# Patient Record
Sex: Male | Born: 1975 | Race: Black or African American | Hispanic: No | Marital: Single | Smoking: Current every day smoker
Health system: Southern US, Community
[De-identification: ages and names within clinical notes are randomized; demographics above are authoritative.]

## PROBLEM LIST (undated history)

## (undated) DIAGNOSIS — J40 Bronchitis, not specified as acute or chronic: Secondary | ICD-10-CM

## (undated) HISTORY — PX: THROAT SURGERY: SHX803

---

## 2003-01-18 ENCOUNTER — Emergency Department (HOSPITAL_COMMUNITY): Admission: EM | Admit: 2003-01-18 | Discharge: 2003-01-18 | Payer: Self-pay | Admitting: Emergency Medicine

## 2007-10-27 ENCOUNTER — Emergency Department (HOSPITAL_COMMUNITY): Admission: EM | Admit: 2007-10-27 | Discharge: 2007-10-27 | Payer: Self-pay | Admitting: Emergency Medicine

## 2008-02-16 ENCOUNTER — Emergency Department (HOSPITAL_COMMUNITY): Admission: EM | Admit: 2008-02-16 | Discharge: 2008-02-16 | Payer: Self-pay | Admitting: Family Medicine

## 2008-02-18 ENCOUNTER — Emergency Department (HOSPITAL_COMMUNITY): Admission: EM | Admit: 2008-02-18 | Discharge: 2008-02-19 | Payer: Self-pay | Admitting: Emergency Medicine

## 2008-03-10 ENCOUNTER — Emergency Department (HOSPITAL_COMMUNITY): Admission: EM | Admit: 2008-03-10 | Discharge: 2008-03-10 | Payer: Self-pay | Admitting: Emergency Medicine

## 2011-06-30 ENCOUNTER — Emergency Department (HOSPITAL_COMMUNITY)
Admission: EM | Admit: 2011-06-30 | Discharge: 2011-06-30 | Disposition: A | Discharge status: Home / Self Care | Payer: Self-pay | Attending: Emergency Medicine | Admitting: Emergency Medicine

## 2011-06-30 DIAGNOSIS — F17200 Nicotine dependence, unspecified, uncomplicated: Secondary | ICD-10-CM | POA: Insufficient documentation

## 2011-06-30 DIAGNOSIS — Z202 Contact with and (suspected) exposure to infections with a predominantly sexual mode of transmission: Secondary | ICD-10-CM | POA: Insufficient documentation

## 2011-06-30 DIAGNOSIS — R369 Urethral discharge, unspecified: Secondary | ICD-10-CM | POA: Insufficient documentation

## 2011-06-30 DIAGNOSIS — R300 Dysuria: Secondary | ICD-10-CM | POA: Insufficient documentation

## 2011-06-30 LAB — URINALYSIS, ROUTINE W REFLEX MICROSCOPIC
Hgb urine dipstick: NEGATIVE
Nitrite: NEGATIVE
Specific Gravity, Urine: 1.021 (ref 1.005–1.030)

## 2011-07-15 ENCOUNTER — Emergency Department (HOSPITAL_COMMUNITY)
Admission: EM | Admit: 2011-07-15 | Discharge: 2011-07-15 | Disposition: A | Discharge status: Home / Self Care | Payer: Self-pay | Attending: Emergency Medicine | Admitting: Emergency Medicine

## 2011-07-15 DIAGNOSIS — A5431 Gonococcal conjunctivitis: Secondary | ICD-10-CM | POA: Insufficient documentation

## 2011-07-15 DIAGNOSIS — H5789 Other specified disorders of eye and adnexa: Secondary | ICD-10-CM | POA: Insufficient documentation

## 2011-07-15 DIAGNOSIS — F17200 Nicotine dependence, unspecified, uncomplicated: Secondary | ICD-10-CM | POA: Insufficient documentation

## 2011-07-16 ENCOUNTER — Inpatient Hospital Stay (INDEPENDENT_AMBULATORY_CARE_PROVIDER_SITE_OTHER)
Admission: RE | Admit: 2011-07-16 | Discharge: 2011-07-16 | Disposition: A | Discharge status: Home / Self Care | Payer: Self-pay | Source: Ambulatory Visit | Attending: Emergency Medicine | Admitting: Emergency Medicine

## 2011-07-16 DIAGNOSIS — R6889 Other general symptoms and signs: Secondary | ICD-10-CM

## 2011-08-16 LAB — BASIC METABOLIC PANEL
BUN: 6
Calcium: 8.9
Chloride: 95 — ABNORMAL LOW
Creatinine, Ser: 1.36
GFR calc Af Amer: >60
Glucose, Bld: 113 — ABNORMAL HIGH
Potassium: 3.5
Sodium: 132 — ABNORMAL LOW

## 2011-08-16 LAB — CBC
Hemoglobin: 15
MCHC: 34
MCV: 80.1
Platelets: 114 — ABNORMAL LOW
RBC: 5.49
WBC: 7.4

## 2011-08-16 LAB — DIFFERENTIAL
Basophils Relative: 0
Eosinophils Absolute: 0.1
Neutro Abs: 5.9

## 2011-08-16 LAB — URINALYSIS, ROUTINE W REFLEX MICROSCOPIC
Glucose, UA: NEGATIVE
Nitrite: NEGATIVE
Protein, ur: NEGATIVE
Specific Gravity, Urine: 1.022
pH: 6.5

## 2011-08-16 LAB — RAPID STREP SCREEN (MED CTR MEBANE ONLY): Streptococcus, Group A Screen (Direct): NEGATIVE

## 2012-10-20 ENCOUNTER — Emergency Department (HOSPITAL_COMMUNITY)
Admission: EM | Admit: 2012-10-20 | Discharge: 2012-10-20 | Disposition: A | Discharge status: Home / Self Care | Payer: Self-pay | Attending: Emergency Medicine | Admitting: Emergency Medicine

## 2012-10-20 ENCOUNTER — Encounter (HOSPITAL_COMMUNITY): Payer: Self-pay | Admitting: Physical Medicine and Rehabilitation

## 2012-10-20 DIAGNOSIS — F17200 Nicotine dependence, unspecified, uncomplicated: Secondary | ICD-10-CM | POA: Insufficient documentation

## 2012-10-20 DIAGNOSIS — R369 Urethral discharge, unspecified: Secondary | ICD-10-CM | POA: Insufficient documentation

## 2012-10-20 LAB — URINE MICROSCOPIC-ADD ON

## 2012-10-20 LAB — URINALYSIS, ROUTINE W REFLEX MICROSCOPIC
Bilirubin Urine: NEGATIVE
Glucose, UA: NEGATIVE mg/dL
Hgb urine dipstick: NEGATIVE
Specific Gravity, Urine: 1.027 (ref 1.005–1.030)

## 2012-10-20 MED ORDER — LIDOCAINE HCL (PF) 1 % IJ SOLN
1.0000 mL | Freq: Once | INTRAMUSCULAR | Status: AC
  Administered 2012-10-20: 1 mL

## 2012-10-20 MED ORDER — LIDOCAINE HCL (PF) 1 % IJ SOLN
INTRAMUSCULAR | Status: AC
  Filled 2012-10-20: qty 5

## 2012-10-20 MED ORDER — AZITHROMYCIN 250 MG PO TABS
1000.0000 mg | ORAL_TABLET | Freq: Once | ORAL | Status: AC
  Administered 2012-10-20: 1000 mg via ORAL
  Filled 2012-10-20: qty 4

## 2012-10-20 MED ORDER — CEFTRIAXONE SODIUM 250 MG IJ SOLR
250.0000 mg | Freq: Once | INTRAMUSCULAR | Status: AC
  Administered 2012-10-20: 250 mg via INTRAMUSCULAR
  Filled 2012-10-20: qty 250

## 2012-10-20 NOTE — ED Notes (Signed)
Pt left without signing discharge paperwork.

## 2012-10-20 NOTE — ED Notes (Signed)
Pt presents to department for evaluation of white colored penile discharge and dysuria. Onset Thursday afternoon. Denies abdominal pain. Denies hematuria. Pt states recent unprotected sex with several partners. He is alert and oriented x4. No signs of distress noted.

## 2012-10-20 NOTE — ED Provider Notes (Signed)
History     CSN: [id number]  Arrival date & time 10/20/12  4098   First MD Initiated Contact with Patient 10/20/12 [phone]      Chief Complaint  Patient presents with  . Penile Discharge    (Consider location/radiation/quality/duration/timing/severity/associated sxs/prior treatment) HPI Comments: Patient with a two-day history of weight creamy penile discharge. He denies any burning on urination. He denies any penile pain. Denies any sores or lesions. He denies any abdominal pain. Denies any nausea vomiting or diarrhea. He denies any pain with bowel movements. He does state that he has unprotected sex with multiple partners. Denies history of sexual transmitted disease.  Patient is a 36 y.o. male presenting with penile discharge.  Penile Discharge Pertinent negatives include no chest pain, no abdominal pain, no headaches and no shortness of breath.    No past medical history on file.  No past surgical history on file.  History reviewed. No pertinent family history.  History  Substance Use Topics  . Smoking status: Current Every Day Smoker    Types: Cigarettes  . Smokeless tobacco: Not on file  . Alcohol Use: Yes      Review of Systems  Constitutional: Negative for fever, chills, diaphoresis and fatigue.  HENT: Negative for congestion, rhinorrhea and sneezing.   Eyes: Negative.   Respiratory: Negative for cough, chest tightness and shortness of breath.   Cardiovascular: Negative for chest pain and leg swelling.  Gastrointestinal: Negative for nausea, vomiting, abdominal pain, diarrhea and blood in stool.  Genitourinary: Positive for discharge. Negative for frequency, hematuria, flank pain and difficulty urinating.  Musculoskeletal: Negative for back pain and arthralgias.  Skin: Negative for rash.  Neurological: Negative for dizziness, speech difficulty, weakness, numbness and headaches.    Allergies  Review of patient's allergies indicates no known allergies.  Home  Medications  No current outpatient prescriptions on file.  BP 122/68  Pulse 84  Temp 98.6 F (37 C) (Oral)  Resp 18  SpO2 100%  Physical Exam  Constitutional: He is oriented to person, place, and time. He appears well-developed and well-nourished.  HENT:  Head: Normocephalic and atraumatic.  Eyes: Pupils are equal, round, and reactive to light.  Neck: Normal range of motion. Neck supple.  Cardiovascular: Normal rate, regular rhythm and normal heart sounds.   Pulmonary/Chest: Effort normal and breath sounds normal. No respiratory distress. He has no wheezes. He has no rales. He exhibits no tenderness.  Abdominal: Soft. Bowel sounds are normal. There is no tenderness. There is no rebound and no guarding.  Genitourinary: Testes normal and penis normal. Circumcised. No penile erythema or penile tenderness. No discharge found.  Musculoskeletal: Normal range of motion. He exhibits no edema.  Lymphadenopathy:    He has no cervical adenopathy.  Neurological: He is alert and oriented to person, place, and time.  Skin: Skin is warm and dry. No rash noted.  Psychiatric: He has a normal mood and affect.    ED Course  Procedures (including critical care time)  Results for orders placed during the hospital encounter of 10/20/12  URINALYSIS, ROUTINE W REFLEX MICROSCOPIC      Component Value Range   Color, Urine YELLOW  YELLOW   APPearance CLOUDY (*) CLEAR   Specific Gravity, Urine 1.027  1.005 - 1.030   pH 6.0  5.0 - 8.0   Glucose, UA NEGATIVE  NEGATIVE mg/dL   Hgb urine dipstick NEGATIVE  NEGATIVE   Bilirubin Urine NEGATIVE  NEGATIVE   Ketones, ur NEGATIVE  NEGATIVE mg/dL  Protein, ur NEGATIVE  NEGATIVE mg/dL   Urobilinogen, UA 1.0  0.0 - 1.0 mg/dL   Nitrite NEGATIVE  NEGATIVE   Leukocytes, UA MODERATE (*) NEGATIVE  URINE MICROSCOPIC-ADD ON      Component Value Range   Squamous Epithelial / LPF RARE  RARE   WBC, UA 21-50  <3 WBC/hpf   RBC / HPF 0-2  <3 RBC/hpf   Bacteria, UA  RARE  RARE   Urine-Other MUCOUS PRESENT     No results found.   1. Penile discharge       MDM  Patient with history of unprotected sex and penile discharge. He was treated here with Rocephin and Zithromax. He has nothing to suggest prostatitis. Advised him he needs to return here or follow up with the health department if his symptoms are not improving. I advised him in sex he transmitted disease avoidance. I also counseled him that he should followup with the health department for further testing such as HIV and syphilis.        Rolan Bucco, MD 10/20/12 [phone]

## 2012-10-23 LAB — GC/CHLAMYDIA PROBE AMP: CT Probe RNA: NEGATIVE

## 2012-10-28 ENCOUNTER — Telehealth (HOSPITAL_COMMUNITY): Payer: Self-pay | Admitting: Emergency Medicine

## 2012-10-28 NOTE — ED Notes (Signed)
+  Gonorrhea. Patient treated with Rocephin and Zithromax. Per protocol MD. [facility] faxed. °

## 2012-10-29 NOTE — ED Notes (Signed)
Unable to contact patient via phone. Sent letter. °

## 2013-08-22 ENCOUNTER — Emergency Department (HOSPITAL_COMMUNITY)
Admission: EM | Admit: 2013-08-22 | Discharge: 2013-08-22 | Disposition: A | Discharge status: Home / Self Care | Payer: Self-pay | Attending: Emergency Medicine | Admitting: Emergency Medicine

## 2013-08-22 ENCOUNTER — Encounter (HOSPITAL_COMMUNITY): Payer: Self-pay | Admitting: *Deleted

## 2013-08-22 DIAGNOSIS — R300 Dysuria: Secondary | ICD-10-CM | POA: Insufficient documentation

## 2013-08-22 DIAGNOSIS — R369 Urethral discharge, unspecified: Secondary | ICD-10-CM | POA: Insufficient documentation

## 2013-08-22 DIAGNOSIS — N481 Balanitis: Secondary | ICD-10-CM

## 2013-08-22 DIAGNOSIS — N476 Balanoposthitis: Secondary | ICD-10-CM | POA: Insufficient documentation

## 2013-08-22 DIAGNOSIS — F17200 Nicotine dependence, unspecified, uncomplicated: Secondary | ICD-10-CM | POA: Insufficient documentation

## 2013-08-22 MED ORDER — STERILE WATER FOR INJECTION IJ SOLN
INTRAMUSCULAR | Status: AC
  Administered 2013-08-22: 1 mL
  Filled 2013-08-22: qty 10

## 2013-08-22 MED ORDER — HYDROCORTISONE 1 % EX CREA: TOPICAL_CREAM | Freq: Two times a day (BID) | CUTANEOUS | Status: DC

## 2013-08-22 MED ORDER — CEFTRIAXONE SODIUM 250 MG IJ SOLR
250.0000 mg | Freq: Once | INTRAMUSCULAR | Status: AC
  Administered 2013-08-22: 250 mg via INTRAMUSCULAR
  Filled 2013-08-22: qty 250

## 2013-08-22 MED ORDER — AZITHROMYCIN 1 G PO PACK
1.0000 g | PACK | Freq: Once | ORAL | Status: AC
  Administered 2013-08-22: 1 g via ORAL
  Filled 2013-08-22: qty 1

## 2013-08-22 NOTE — ED Provider Notes (Signed)
CSN: [id number]     Arrival date & time 08/22/13  1723 History  This chart was scribed for [facility] Farrior, PA, working with Glynn Octave, MD, by Allene Dillon, ED Scribe. This patient was seen in room TR07C/TR07C and the patient's care was started at 7:58 PM.    Chief Complaint  Patient presents with  . Penile Discharge  . Dysuria   (Consider location/radiation/quality/duration/timing/severity/associated sxs/prior Treatment) The history is provided by the patient. No language interpreter was used.    HPI Comments: John Trevino is a 37 y.o. male who presents to the Emergency Department complaining of penile clear discharge x1 day after having unprotected sex recently. Patient also endorses moderate dysuria since the penile discharge occurred earlier today. Patient is also complaining about dry irritated skin on the penis that has been present for about a week. He denies any alleviating factors or aggravating factors for either the discharge or skin irritation. Patient denies any fevers, penile or testicular swelling.   History reviewed. No pertinent past medical history. History reviewed. No pertinent past surgical history. History reviewed. No pertinent family history. History  Substance Use Topics  . Smoking status: Current Every Day Smoker    Types: Cigarettes  . Smokeless tobacco: Not on file  . Alcohol Use: Yes    Review of Systems  Constitutional: Negative for fever.  Genitourinary: Positive for dysuria and discharge. Negative for frequency, hematuria, flank pain, penile swelling, scrotal swelling and testicular pain.  Skin: Negative for rash and wound.    Allergies  Review of patient's allergies indicates no known allergies.  Home Medications   Current Outpatient Rx  Name  Route  Sig  Dispense  Refill  . Acetaminophen (TYLENOL PO)   Oral   Take 4 tablets by mouth once.         . hydrocortisone cream 1 %   Topical   Apply topically 2 (two) times  daily.   30 g   2    Triage Vitals: BP 140/62  Pulse 61  Temp(Src) 98.2 F (36.8 C) (Oral)  Ht 5\' 10"  (1.778 m)  Wt 190 lb 4.1 oz (86.3 kg)  BMI 27.3 kg/m2  SpO2 98% Physical Exam  Constitutional: He is oriented to person, place, and time. He appears well-developed and well-nourished. No distress.  HENT:  Head: Normocephalic and atraumatic.  Right Ear: External ear normal.  Left Ear: External ear normal.  Nose: Nose normal.  Eyes: Conjunctivae and EOM are normal. Pupils are equal, round, and reactive to light.  Neck: Neck supple.  Cardiovascular: Normal rate, regular rhythm, normal heart sounds and intact distal pulses.   Pulmonary/Chest: Effort normal and breath sounds normal.  Abdominal: Soft. There is no tenderness.  Genitourinary: Testes normal.    Right testis shows no mass, no swelling and no tenderness. Right testis is descended. Left testis shows no mass, no swelling and no tenderness. Left testis is descended. Circumcised. No phimosis, paraphimosis, hypospadias or penile tenderness. Discharge found.  Mildly erythematous region with scaling marked on graphical documentation. No warmth, nontender, no drainage.   Lymphadenopathy:       Right: No inguinal adenopathy present.       Left: No inguinal adenopathy present.  Neurological: He is alert and oriented to person, place, and time. He has normal strength. No cranial nerve deficit or sensory deficit. Gait normal. GCS eye subscore is 4. GCS verbal subscore is 5. GCS motor subscore is 6.  No pronator drift. Bilateral heel-knee-shin intact.  Skin:  Skin is warm and dry. No petechiae and no rash noted. He is not diaphoretic.  Psychiatric: He has a normal mood and affect.    ED Course  Procedures (including critical care time) DIAGNOSTIC STUDIES: Oxygen Saturation is 98% on RA, normal by my interpretation.    COORDINATION OF CARE: 7:58 PM- Pt advised of plan for treatment and pt agrees.    Labs Review Labs Reviewed   GC/CHLAMYDIA PROBE AMP   Imaging Review No results found.  MDM   1. Penile discharge   2. Balanitis     Afebrile, NAD, non-toxic appearing, AAOx4.  Patient to be discharged with instructions to follow up with OBGYN. Pt understands GC/Chlamydia cultures pending and that they will need to inform all sexual partners within the last 6 months if results return positive. Pt has been treated prophylacticly with azithromycin and rocephin due to pts history and PE findings. Also signs of mild balanitis provided hydrocortisone cream for itching, discussed hygiene, but is being covered for GC/Chlamydia at today's visit. Pt advised that she will receive a call in 48 hours if the test is positive and to refrain from sexual activity for 48 hours. If the test is positive, pt is advised to refrain from sexual activity for 10 days for the medicine to take effect.  Discussed that because pt has had recent unprotected sex, might want to consider getting tested for HIV as well. Counseled pt that latex condoms are the only way to prevent against STDs or HIV. Return precautions discussed. Patient is agreeable to plan.          I personally performed the services described in this documentation, which was scribed in my presence. The recorded information has been reviewed and is accurate.     Lise Auer Cheney Ewart, PA-C 08/23/13 0005

## 2013-08-22 NOTE — ED Notes (Signed)
Pt reports unprotected sex. Pt reports dicharge started today.

## 2013-08-22 NOTE — ED Notes (Signed)
Pt states that he has been having burning with urination this afternoon and having a white, milky discharge from his penis

## 2013-08-23 NOTE — ED Provider Notes (Signed)
Medical screening examination/treatment/procedure(s) were performed by non-physician practitioner and as supervising physician I was immediately available for consultation/collaboration.   Corinn Stoltzfus, MD 08/23/13 0027 

## 2013-08-25 ENCOUNTER — Telehealth (HOSPITAL_COMMUNITY): Payer: Self-pay | Admitting: Emergency Medicine

## 2013-08-25 NOTE — ED Notes (Signed)
+  gonorrhea. Patient treated with Rocephin and Zithromax. [facility] faxed.

## 2013-08-25 NOTE — ED Notes (Signed)
Patient has +Gonorrhea. °

## 2013-08-27 NOTE — ED Notes (Signed)
Unable to contact via phone letter sent to [facility] address. 

## 2013-10-05 ENCOUNTER — Telehealth (HOSPITAL_COMMUNITY): Payer: Self-pay | Admitting: Emergency Medicine

## 2013-10-05 NOTE — ED Notes (Signed)
No response to letter sent after 30 days. Chart sent to Medical Records. °

## 2013-12-01 ENCOUNTER — Encounter (HOSPITAL_COMMUNITY): Payer: Self-pay | Admitting: Emergency Medicine

## 2013-12-01 ENCOUNTER — Emergency Department (HOSPITAL_COMMUNITY)
Admission: EM | Admit: 2013-12-01 | Discharge: 2013-12-01 | Disposition: A | Discharge status: Home / Self Care | Payer: Self-pay | Attending: Emergency Medicine | Admitting: Emergency Medicine

## 2013-12-01 DIAGNOSIS — M62830 Muscle spasm of back: Secondary | ICD-10-CM

## 2013-12-01 DIAGNOSIS — M5380 Other specified dorsopathies, site unspecified: Secondary | ICD-10-CM | POA: Insufficient documentation

## 2013-12-01 DIAGNOSIS — F17200 Nicotine dependence, unspecified, uncomplicated: Secondary | ICD-10-CM | POA: Insufficient documentation

## 2013-12-01 MED ORDER — CYCLOBENZAPRINE HCL 10 MG PO TABS: 10.0000 mg | ORAL_TABLET | Freq: Three times a day (TID) | ORAL | Status: DC | PRN

## 2013-12-01 MED ORDER — IBUPROFEN 800 MG PO TABS: 800.0000 mg | ORAL_TABLET | Freq: Three times a day (TID) | ORAL | Status: DC

## 2013-12-01 MED ORDER — CYCLOBENZAPRINE HCL 10 MG PO TABS
10.0000 mg | ORAL_TABLET | Freq: Once | ORAL | Status: AC
  Administered 2013-12-01: 10 mg via ORAL
  Filled 2013-12-01: qty 1

## 2013-12-01 NOTE — Discharge Instructions (Signed)
Please follow up with your primary care physician in 1-2 days. If you do not have one please call one from list below. Please take Flexeril as prescribed, please do not drive on this medication as it may make you drowsy. Please take Motrin as prescribed. Please read all discharge instructions and return precautions.   Back Pain, Adult Low back pain is very common. About 1 in 5 people have back pain.The cause of low back pain is rarely dangerous. The pain often gets better over time.About half of people with a sudden onset of back pain feel better in just 2 weeks. About 8 in 10 people feel better by 6 weeks.  CAUSES Some common causes of back pain include:  Strain of the muscles or ligaments supporting the spine.  Wear and tear (degeneration) of the spinal discs.  Arthritis.  Direct injury to the back. DIAGNOSIS Most of the time, the direct cause of low back pain is not known.However, back pain can be treated effectively even when the exact cause of the pain is unknown.Answering your caregiver's questions about your overall health and symptoms is one of the most accurate ways to make sure the cause of your pain is not dangerous. If your caregiver needs more information, he or she may order lab work or imaging tests (X-rays or MRIs).However, even if imaging tests show changes in your back, this usually does not require surgery. HOME CARE INSTRUCTIONS For many people, back pain returns.Since low back pain is rarely dangerous, it is often a condition that people can learn to [facility] their own.   Remain active. It is stressful on the back to sit or stand in one place. Do not sit, drive, or stand in one place for more than 30 minutes at a time. Take short walks on level surfaces as soon as pain allows.Try to increase the length of time you walk each day.  Do not stay in bed.Resting more than 1 or 2 days can delay your recovery.  Do not avoid exercise or work.Your body is made to move.It  is not dangerous to be active, even though your back may hurt.Your back will likely heal faster if you return to being active before your pain is gone.  Pay attention to your body when you bend and lift. Many people have less discomfortwhen lifting if they bend their knees, keep the load close to their bodies,and avoid twisting. Often, the most comfortable positions are those that put less stress on your recovering back.  Find a comfortable position to sleep. Use a firm mattress and lie on your side with your knees slightly bent. If you lie on your back, put a pillow under your knees.  Only take over-the-counter or prescription medicines as directed by your caregiver. Over-the-counter medicines to reduce pain and inflammation are often the most helpful.Your caregiver may prescribe muscle relaxant drugs.These medicines help dull your pain so you can more quickly return to your normal activities and healthy exercise.  Put ice on the injured area.  Put ice in a plastic bag.  Place a towel between your skin and the bag.  Leave the ice on for 15-20 minutes, 03-04 times a day for the first 2 to 3 days. After that, ice and heat may be alternated to reduce pain and spasms.  Ask your caregiver about trying back exercises and gentle massage. This may be of some benefit.  Avoid feeling anxious or stressed.Stress increases muscle tension and can worsen back pain.It is important to recognize  when you are anxious or stressed and learn ways to manage it.Exercise is a great option. SEEK MEDICAL CARE IF:  You have pain that is not relieved with rest or medicine.  You have pain that does not improve in 1 week.  You have new symptoms.  You are generally not feeling well. SEEK IMMEDIATE MEDICAL CARE IF:   You have pain that radiates from your back into your legs.  You develop new bowel or bladder control problems.  You have unusual weakness or numbness in your arms or legs.  You develop  nausea or vomiting.  You develop abdominal pain.  You feel faint. Document Released: 11/08/2005 Document Revised: 05/09/2012 Document Reviewed: 03/29/2011 [facility] Patient Information 2014 [place], [place].  Muscle Cramps and Spasms Muscle cramps and spasms occur when a muscle or muscles tighten and you have no control over this tightening (involuntary muscle contraction). They are a common problem and can develop in any muscle. The most common place is in the calf muscles of the leg. Both muscle cramps and muscle spasms are involuntary muscle contractions, but they also have differences:   Muscle cramps are sporadic and painful. They may last a few seconds to a quarter of an hour. Muscle cramps are often more forceful and last longer than muscle spasms.  Muscle spasms may or may not be painful. They may also last just a few seconds or much longer. CAUSES  It is uncommon for cramps or spasms to be due to a serious underlying problem. In many cases, the cause of cramps or spasms is unknown. Some common causes are:   Overexertion.   Overuse from repetitive motions (doing the same thing over and over).   Remaining in a certain position for a long period of time.   Improper preparation, form, or technique while performing a sport or activity.   Dehydration.   Injury.   Side effects of some medicines.   Abnormally low levels of the salts and ions in your blood (electrolytes), especially potassium and calcium. This could happen if you are taking water pills (diuretics) or you are pregnant.  Some underlying medical problems can make it more likely to develop cramps or spasms. These include, but are not limited to:   Diabetes.   Parkinson disease.   Hormone disorders, such as thyroid problems.   Alcohol abuse.   Diseases specific to muscles, joints, and bones.   Blood vessel disease where not enough blood is getting to the muscles.  HOME CARE INSTRUCTIONS   Stay  well hydrated. Drink enough water and fluids to keep your urine clear or pale yellow.  It may be helpful to massage, stretch, and relax the affected muscle.  For tight or tense muscles, use a warm towel, heating pad, or hot shower water directed to the affected area.  If you are sore or have pain after a cramp or spasm, applying ice to the affected area may relieve discomfort.  Put ice in a plastic bag.  Place a towel between your skin and the bag.  Leave the ice on for 15-20 minutes, 03-04 times a day.  Medicines used to treat a known cause of cramps or spasms may help reduce their frequency or severity. Only take over-the-counter or prescription medicines as directed by your caregiver. SEEK MEDICAL CARE IF:  Your cramps or spasms get more severe, more frequent, or do not improve over time.  MAKE SURE YOU:   Understand these instructions.  Will watch your condition.  Will get  help right away if you are not doing well or get worse. Document Released: 04/30/2002 Document Revised: 03/05/2013 Document Reviewed: 10/25/2012 [facility] Patient Information 2014 [place], [place].

## 2013-12-01 NOTE — ED Notes (Signed)
Pt reports lower back pain x3 days after sneezing. Reports previous injury to back when he was younger. Denies urinary sx or fever.

## 2013-12-01 NOTE — ED Provider Notes (Signed)
CSN: [id number]     Arrival date & time 12/01/13  4098 History   First MD Initiated Contact with Patient 12/01/13 [phone]     Chief Complaint  Patient presents with  . Back Pain   (Consider location/radiation/quality/duration/timing/severity/associated sxs/prior Treatment) HPI Comments: Patient is a 38 year old male past medical history significant for tobacco use presenting to the emergency department for right lower back pain that began 3 days ago after sneezing. Patient describes his pain as moderate to severe tightness without radiation. He has tried over-the-counter pain medications and heating pads with minimal relief. He states his pain is aggravated by walking. He denies any fevers, bladder or bowel incontinence, history of back surgeries, history of cancer, history of IV drug use.  Patient is a 38 y.o. male presenting with back pain.  Back Pain Associated symptoms: no fever     History reviewed. No pertinent past medical history. History reviewed. No pertinent past surgical history. No family history on file. History  Substance Use Topics  . Smoking status: Current Every Day Smoker    Types: Cigarettes  . Smokeless tobacco: Not on file  . Alcohol Use: Yes    Review of Systems  Constitutional: Negative for fever.  Musculoskeletal: Positive for back pain. Negative for neck pain.  All other systems reviewed and are negative.    Allergies  Review of patient's allergies indicates no known allergies.  Home Medications   Current Outpatient Rx  Name  Route  Sig  Dispense  Refill  . aspirin-acetaminophen-caffeine (EXCEDRIN MIGRAINE) 250-250-65 MG per tablet   Oral   Take 1-2 tablets by mouth every 6 (six) hours as needed for headache.         . ibuprofen (ADVIL,MOTRIN) 200 MG tablet   Oral   Take 200-800 mg by mouth every 6 (six) hours as needed for mild pain or moderate pain.         . cyclobenzaprine (FLEXERIL) 10 MG tablet   Oral   Take 1 tablet (10 mg total) by  mouth 3 (three) times daily as needed for muscle spasms.   10 tablet   0   . ibuprofen (ADVIL,MOTRIN) 800 MG tablet   Oral   Take 1 tablet (800 mg total) by mouth 3 (three) times daily.   21 tablet   0    There were no vitals taken for this visit. Physical Exam  Constitutional: He is oriented to person, place, and time. He appears well-developed and well-nourished. No distress.  HENT:  Head: Normocephalic and atraumatic.  Right Ear: External ear normal.  Left Ear: External ear normal.  Nose: Nose normal.  Mouth/Throat: Oropharynx is clear and moist. No oropharyngeal exudate.  Eyes: Conjunctivae and EOM are normal. Pupils are equal, round, and reactive to light.  Neck: Normal range of motion. Neck supple.  Cardiovascular: Normal rate, regular rhythm, normal heart sounds and intact distal pulses.   Pulmonary/Chest: Effort normal and breath sounds normal. No respiratory distress.  Abdominal: Soft. There is no tenderness.  Musculoskeletal:       Cervical back: Normal.       Thoracic back: Normal.       Lumbar back: He exhibits tenderness and spasm. He exhibits normal range of motion and no bony tenderness.       Back:  Neurological: He is alert and oriented to person, place, and time. He has normal strength. No cranial nerve deficit or sensory deficit. Gait normal. GCS eye subscore is 4. GCS verbal subscore is 5. GCS  motor subscore is 6.  No pronator drift. Bilateral heel-knee-shin intact.  Skin: Skin is warm and dry. He is not diaphoretic.    ED Course  Procedures (including critical care time) Medications  cyclobenzaprine (FLEXERIL) tablet 10 mg (not administered)    Labs Review Labs Reviewed - No data to display Imaging Review No results found.  EKG Interpretation   None       MDM   1. Back spasm     Afebrile, NAD, non-toxic appearing, AAOx4. Patient with back pain.  No neurological deficits and normal neuro exam.  Patient can walk but states is painful.  No  loss of bowel or bladder control.  No concern for cauda equina.  No fever, night sweats, weight loss, h/o cancer, IVDU.  RICE protocol and pain medicine indicated and discussed with patient.       Jeannetta EllisJennifer L Farheen Pfahler, PA-C 12/01/13 [phone]

## 2013-12-01 NOTE — ED Provider Notes (Signed)
Medical screening examination/treatment/procedure(s) were performed by non-physician practitioner and as supervising physician I was immediately available for consultation/collaboration.   Mykel Sponaugle, MD 12/01/13 0800 

## 2013-12-01 NOTE — ED Notes (Signed)
C/o R lower back pain, (denies: nvd, fever, loss of control of bowel or bladder, weakness, tripping, radiation, numbness/tinglling/ burning or other sx), took off brand of excedrin (not sure/given to him), no meds in last 8 hrs.

## 2014-01-26 DIAGNOSIS — R369 Urethral discharge, unspecified: Secondary | ICD-10-CM | POA: Insufficient documentation

## 2014-01-26 DIAGNOSIS — Z8619 Personal history of other infectious and parasitic diseases: Secondary | ICD-10-CM | POA: Insufficient documentation

## 2014-01-26 DIAGNOSIS — R300 Dysuria: Secondary | ICD-10-CM | POA: Insufficient documentation

## 2014-01-26 DIAGNOSIS — Z113 Encounter for screening for infections with a predominantly sexual mode of transmission: Secondary | ICD-10-CM | POA: Insufficient documentation

## 2014-01-26 DIAGNOSIS — Z791 Long term (current) use of non-steroidal anti-inflammatories (NSAID): Secondary | ICD-10-CM | POA: Insufficient documentation

## 2014-01-26 DIAGNOSIS — F17200 Nicotine dependence, unspecified, uncomplicated: Secondary | ICD-10-CM | POA: Insufficient documentation

## 2014-01-26 DIAGNOSIS — N489 Disorder of penis, unspecified: Secondary | ICD-10-CM | POA: Insufficient documentation

## 2014-01-27 ENCOUNTER — Encounter (HOSPITAL_COMMUNITY): Payer: Self-pay | Admitting: Emergency Medicine

## 2014-01-27 ENCOUNTER — Emergency Department (HOSPITAL_COMMUNITY)
Admission: EM | Admit: 2014-01-27 | Discharge: 2014-01-27 | Disposition: A | Discharge status: Home / Self Care | Payer: Self-pay | Attending: Emergency Medicine | Admitting: Emergency Medicine

## 2014-01-27 DIAGNOSIS — R369 Urethral discharge, unspecified: Secondary | ICD-10-CM

## 2014-01-27 DIAGNOSIS — Z711 Person with feared health complaint in whom no diagnosis is made: Secondary | ICD-10-CM

## 2014-01-27 LAB — HIV ANTIBODY (ROUTINE TESTING W REFLEX): HIV: NONREACTIVE

## 2014-01-27 MED ORDER — AZITHROMYCIN 250 MG PO TABS
1000.0000 mg | ORAL_TABLET | Freq: Once | ORAL | Status: AC
  Administered 2014-01-27: 1000 mg via ORAL
  Filled 2014-01-27: qty 4

## 2014-01-27 MED ORDER — LIDOCAINE HCL (PF) 1 % IJ SOLN
INTRAMUSCULAR | Status: AC
  Filled 2014-01-27: qty 5

## 2014-01-27 MED ORDER — CEFTRIAXONE SODIUM 250 MG IJ SOLR
250.0000 mg | Freq: Once | INTRAMUSCULAR | Status: AC
  Administered 2014-01-27: 250 mg via INTRAMUSCULAR
  Filled 2014-01-27: qty 250

## 2014-01-27 MED ORDER — LIDOCAINE HCL (CARDIAC) 20 MG/ML IV SOLN
INTRAVENOUS | Status: DC
Start: 2014-01-27 — End: 2014-01-27
  Filled 2014-01-27: qty 5

## 2014-01-27 NOTE — ED Provider Notes (Signed)
Medical screening examination/treatment/procedure(s) were performed by non-physician practitioner and as supervising physician I was immediately available for consultation/collaboration.   EKG Interpretation None       Malissa Slay M Jermya Dowding, MD 01/27/14 0738 

## 2014-01-27 NOTE — ED Notes (Signed)
Pt states that he has discharge and painful urination for 2 days. Pt has history of gonhhera.

## 2014-01-27 NOTE — ED Provider Notes (Signed)
CSN: [id number]     Arrival date & time 01/26/14  2359 History   First MD Initiated Contact with Patient 01/27/14 0017     Chief Complaint  Patient presents with  . SEXUALLY TRANSMITTED DISEASE     (Consider location/radiation/quality/duration/timing/severity/associated sxs/prior Treatment) HPI Pt is a 38yo male with hx of gonorrhea c/o penile discharge and painful urination x2 days.  Pt states he feels like he has gonorrhea again. Does report 1 episode of unprotected sex but states the male was tested and was "clean." denies fever, n/v/d. Has not taken medication PTA. No allergies to medications.  History reviewed. No pertinent past medical history. History reviewed. No pertinent past surgical history. History reviewed. No pertinent family history. History  Substance Use Topics  . Smoking status: Current Every Day Smoker    Types: Cigarettes  . Smokeless tobacco: Not on file  . Alcohol Use: Yes    Review of Systems  Constitutional: Negative for fever and chills.  Gastrointestinal: Negative for nausea and vomiting.  Genitourinary: Positive for dysuria, discharge and penile pain. Negative for hematuria.  Musculoskeletal: Negative for back pain.  All other systems reviewed and are negative.       Allergies  Review of patient's allergies indicates no known allergies.  Home Medications   Current Outpatient Rx  Name  Route  Sig  Dispense  Refill  . aspirin-acetaminophen-caffeine (EXCEDRIN MIGRAINE) 250-250-65 MG per tablet   Oral   Take 1-2 tablets by mouth every 6 (six) hours as needed for headache.         . cyclobenzaprine (FLEXERIL) 10 MG tablet   Oral   Take 1 tablet (10 mg total) by mouth 3 (three) times daily as needed for muscle spasms.   10 tablet   0   . ibuprofen (ADVIL,MOTRIN) 200 MG tablet   Oral   Take 200-800 mg by mouth every 6 (six) hours as needed for mild pain or moderate pain.         Marland Kitchen ibuprofen (ADVIL,MOTRIN) 800 MG tablet   Oral   Take  1 tablet (800 mg total) by mouth 3 (three) times daily.   21 tablet   0    BP 115/74  Pulse 100  Temp(Src) 99 F (37.2 C) (Oral)  Resp 16  Ht 5\' 10"  (1.778 m)  Wt 192 lb 7 oz (87.289 kg)  BMI 27.61 kg/m2  SpO2 98% Physical Exam  Nursing note and vitals reviewed. Constitutional: He is oriented to person, place, and time. He appears well-developed and well-nourished.  HENT:  Head: Normocephalic and atraumatic.  Eyes: EOM are normal.  Neck: Normal range of motion.  Cardiovascular: Normal rate.   Pulmonary/Chest: Effort normal. No respiratory distress.  Abdominal: Soft. There is no tenderness.  Genitourinary: Testes normal. Right testis shows no mass, no swelling and no tenderness. Left testis shows no mass, no swelling and no tenderness. Circumcised. No phimosis, paraphimosis, hypospadias, penile erythema or penile tenderness. Discharge ( scant, yellow-white) found.  Chaperoned exam.  Musculoskeletal: Normal range of motion.  Neurological: He is alert and oriented to person, place, and time.  Skin: Skin is warm and dry.  Psychiatric: He has a normal mood and affect. His behavior is normal.     ED Course  Procedures (including critical care time) Labs Review Labs Reviewed  GC/CHLAMYDIA PROBE AMP  HIV ANTIBODY (ROUTINE TESTING)   Imaging Review No results found.   EKG Interpretation None      MDM   Final diagnoses:  Concern  about STD in male without diagnosis  Penile discharge    Pt presenting with hx of gonorrhea c/o penile discharge and dysuria x2 days. Will tx empirically with azithromycin and rocephin. Advised to f/u with PCP and have all sexual partners tested and tx. Advised pt he can f/u with health department for further STD testing as needed. Pt verbalized understanding and agreement of tx plan.     Junius FinnerErin O'Malley, PA-C 01/27/14 [id number]

## 2014-01-27 NOTE — Discharge Instructions (Signed)
°  Refrain from sexual intercourse for 7 days. Be sure to have all partners tested and treated for STDs.  Practice safe sex by always wearing condoms.  ° °

## 2014-01-28 LAB — GC/CHLAMYDIA PROBE AMP
CT Probe RNA: NEGATIVE
GC Probe RNA: NEGATIVE

## 2014-02-20 ENCOUNTER — Emergency Department (HOSPITAL_COMMUNITY): Payer: Self-pay

## 2014-02-20 ENCOUNTER — Emergency Department (HOSPITAL_COMMUNITY)
Admission: EM | Admit: 2014-02-20 | Discharge: 2014-02-21 | Disposition: A | Discharge status: Home / Self Care | Payer: Self-pay | Attending: Emergency Medicine | Admitting: Emergency Medicine

## 2014-02-20 ENCOUNTER — Encounter (HOSPITAL_COMMUNITY): Payer: Self-pay | Admitting: Emergency Medicine

## 2014-02-20 DIAGNOSIS — J40 Bronchitis, not specified as acute or chronic: Secondary | ICD-10-CM

## 2014-02-20 DIAGNOSIS — F17200 Nicotine dependence, unspecified, uncomplicated: Secondary | ICD-10-CM | POA: Insufficient documentation

## 2014-02-20 DIAGNOSIS — IMO0001 Reserved for inherently not codable concepts without codable children: Secondary | ICD-10-CM | POA: Insufficient documentation

## 2014-02-20 DIAGNOSIS — J209 Acute bronchitis, unspecified: Secondary | ICD-10-CM | POA: Insufficient documentation

## 2014-02-20 MED ORDER — AZITHROMYCIN 250 MG PO TABS: 250.0000 mg | ORAL_TABLET | Freq: Every day | ORAL | Status: DC

## 2014-02-20 MED ORDER — HYDROCODONE-ACETAMINOPHEN 7.5-325 MG/15ML PO SOLN: 10.0000 mL | Freq: Four times a day (QID) | ORAL | Status: DC | PRN

## 2014-02-20 NOTE — ED Notes (Signed)
Pt states cough and body aches for 10 days.  Pt staes "Want to make sure i dont have peumonia"

## 2014-02-20 NOTE — ED Notes (Signed)
Patient transported to X-ray 

## 2014-02-20 NOTE — ED Provider Notes (Signed)
CSN: [id number]     Arrival date & time 02/20/14  2146 History  This chart was scribed for non-physician practitioner, Fayrene Helper, PA-C working with Geoffery Lyons, MD by Luisa Dago, ED scribe. This patient was seen in room TR06C/TR06C and the patient's care was started at 11:19 PM.    Chief Complaint  Patient presents with  . Cough   The history is provided by the patient. No language interpreter was used.   HPI Comments: John Trevino is a 38 y.o. male who presents to the Emergency Department complaining of gradual onset constant cough that started 10 days ago. Pt is also complaining of associated generalized myalgias. Mr. Heap is concerned that she may have "pneumonia". He states that if feels like he has "a lot of fluid" in his chest. He states that he has associated green/yellow colored mucus. He reports taking Mucinex DM with no relief. Pt does not recall being around any sick contacts. Pt does not have a history of asthma. Denies any fever, chills, diaphoresis, nausea, rash, coughing up blood, or emesis. Pt is a habitual smoker.   History reviewed. No pertinent past medical history. History reviewed. No pertinent past surgical history. No family history on file. History  Substance Use Topics  . Smoking status: Current Every Day Smoker -- 0.50 packs/day    Types: Cigarettes  . Smokeless tobacco: Not on file  . Alcohol Use: Yes    Review of Systems  Constitutional: Negative for fever, chills and diaphoresis.  Eyes: Negative for visual disturbance.  Respiratory: Positive for cough. Negative for shortness of breath.   Cardiovascular: Negative for chest pain.  Gastrointestinal: Negative for nausea and abdominal pain.  Musculoskeletal: Positive for myalgias.      Allergies  Review of patient's allergies indicates no known allergies.  Home Medications   Current Outpatient Rx  Name  Route  Sig  Dispense  Refill  . dextromethorphan-guaiFENesin (MUCINEX DM) 30-600 MG per 12 hr  tablet   Oral   Take 1 tablet by mouth 2 (two) times daily as needed for cough.         . Pseudoephedrine-Ibuprofen (ADVIL COLD/SINUS) 30-200 MG TABS   Oral   Take 2 capsules by mouth daily as needed (for cold).          Triage Vitals: BP 161/90  Pulse 91  Temp(Src) 99.1 F (37.3 C) (Oral)  Resp 20  Ht 5\' 10"  (1.778 m)  Wt 190 lb 9 oz (86.439 kg)  BMI 27.34 kg/m2  SpO2 99%  Physical Exam  Nursing note and vitals reviewed. Constitutional: He appears well-developed and well-nourished. No distress.  HENT:  Head: Normocephalic and atraumatic.  Right Ear: Tympanic membrane normal.  Left Ear: Tympanic membrane normal.  Mouth/Throat: Uvula is midline. No oropharyngeal exudate, posterior oropharyngeal edema or posterior oropharyngeal erythema.  Right ear with cerumen impaction. Uvula midlined.  Eyes: Conjunctivae are normal. Right eye exhibits no discharge. Left eye exhibits no discharge.  Neck: Neck supple.  Cardiovascular: Normal rate, regular rhythm and normal heart sounds.  Exam reveals no gallop and no friction rub.   No murmur heard. Pulmonary/Chest: Effort normal. No respiratory distress. He has no wheezes. He has rhonchi (mild). He has no rales.  Abdominal: Soft. He exhibits no distension. There is no tenderness.  Musculoskeletal: He exhibits no edema and no tenderness.  Lymphadenopathy:       Head (right side): No tonsillar adenopathy present.       Head (left side): No tonsillar adenopathy  present.    He has cervical adenopathy.  Neurological: He is alert.  Skin: Skin is warm and dry.  Psychiatric: He has a normal mood and affect. His behavior is normal. Thought content normal.    ED Course  Procedures (including critical care time)  DIAGNOSTIC STUDIES: Oxygen Saturation is 99% on RA, normal by my interpretation.    COORDINATION OF CARE: 11:21 PM-Sxs suggestive of bronchitis or viral infection.  CXR without acute infiltrates.  Given the duration of his sxs  more than 10 days, will prescribe z-pack and cough medication. Reassured pt that his symptoms were not due to pneumonia. Advised him to continue taking the Mucinex and to eat. Pt advised of plan for treatment and pt agrees.  Smoking cessation discussed.    Labs Review Labs Reviewed - No data to display Imaging Review Dg Chest 2 View  02/20/2014   CLINICAL DATA:  COUGH  EXAM: CHEST  2 VIEW  COMPARISON:  CT CHEST W/O CM dated 10/27/2007; DG CHEST 2 VIEW dated 10/27/2007  FINDINGS: The heart size and mediastinal contours are within normal limits. Both lungs are clear. The visualized skeletal structures are unremarkable.  IMPRESSION: No active cardiopulmonary disease.   Electronically Signed   By: Salome HolmesHector  Cooper M.D.   On: 02/20/2014 23:11     EKG Interpretation None      MDM   Final diagnoses:  Bronchitis    BP 161/90  Pulse 91  Temp(Src) 99.1 F (37.3 C) (Oral)  Resp 20  Ht 5\' 10"  (1.778 m)  Wt 190 lb 9 oz (86.439 kg)  BMI 27.34 kg/m2  SpO2 99%   I personally performed the services described in this documentation, which was scribed in my presence. The recorded information has been reviewed and is accurate.     Fayrene HelperBowie Malcomb Gangemi, PA-C 02/20/14 2355

## 2014-02-20 NOTE — Discharge Instructions (Signed)

## 2014-02-21 NOTE — ED Provider Notes (Signed)
Medical screening examination/treatment/procedure(s) were performed by non-physician practitioner and as supervising physician I was immediately available for consultation/collaboration.     Geoffery Lyonsouglas Laquon Emel, MD 02/21/14 0000

## 2014-09-05 ENCOUNTER — Emergency Department (HOSPITAL_COMMUNITY)
Admission: EM | Admit: 2014-09-05 | Discharge: 2014-09-05 | Disposition: A | Discharge status: Home / Self Care | Payer: Self-pay | Attending: Emergency Medicine | Admitting: Emergency Medicine

## 2014-09-05 ENCOUNTER — Encounter (HOSPITAL_COMMUNITY): Payer: Self-pay | Admitting: Emergency Medicine

## 2014-09-05 DIAGNOSIS — R369 Urethral discharge, unspecified: Secondary | ICD-10-CM | POA: Insufficient documentation

## 2014-09-05 DIAGNOSIS — Z720 Tobacco use: Secondary | ICD-10-CM | POA: Insufficient documentation

## 2014-09-05 DIAGNOSIS — R300 Dysuria: Secondary | ICD-10-CM | POA: Insufficient documentation

## 2014-09-05 DIAGNOSIS — Z792 Long term (current) use of antibiotics: Secondary | ICD-10-CM | POA: Insufficient documentation

## 2014-09-05 LAB — HIV ANTIBODY (ROUTINE TESTING W REFLEX): HIV 1&2 Ab, 4th Generation: NONREACTIVE

## 2014-09-05 LAB — RPR

## 2014-09-05 MED ORDER — AZITHROMYCIN 250 MG PO TABS
1000.0000 mg | ORAL_TABLET | Freq: Once | ORAL | Status: AC
  Administered 2014-09-05: 1000 mg via ORAL
  Filled 2014-09-05: qty 4

## 2014-09-05 MED ORDER — CEFTRIAXONE SODIUM 250 MG IJ SOLR
250.0000 mg | Freq: Once | INTRAMUSCULAR | Status: AC
  Administered 2014-09-05: 250 mg via INTRAMUSCULAR
  Filled 2014-09-05: qty 250

## 2014-09-05 MED ORDER — LIDOCAINE HCL (PF) 1 % IJ SOLN
INTRAMUSCULAR | Status: AC
  Administered 2014-09-05: 5 mL
  Filled 2014-09-05: qty 5

## 2014-09-05 NOTE — ED Provider Notes (Signed)
CSN: [id number]     Arrival date & time 09/05/14  1204 History  This chart was scribed for non-physician practitioner Junious SilkHannah Anoushka Divito, working with Richardean Canalavid H Yao, MD by Carl Bestelina Holson, ED Scribe. This patient was seen in room TR09C/TR09C and the patient's care was started at 1:25 PM.    Chief Complaint  Patient presents with  . SEXUALLY TRANSMITTED DISEASE    The history is provided by the patient. No language interpreter was used.   HPI Comments: John Trevino is a 38 y.o. male with a history of gonorrhea who presents to the Emergency Department complaining of constant penile discharge with mild dysuria that started 3 days ago.  He has only been sexually active with one partner.  He states that his symptoms do not feel as severe as the gonorrhea he has had in the past.  He denies fever, chills, abdominal pain, and pain with ejaculation as associated symptoms.    History reviewed. No pertinent past medical history. History reviewed. No pertinent past surgical history. No family history on file. History  Substance Use Topics  . Smoking status: Current Every Day Smoker -- 1.00 packs/day    Types: Cigarettes  . Smokeless tobacco: Not on file  . Alcohol Use: Yes    Review of Systems  Constitutional: Negative for fever and chills.  Gastrointestinal: Negative for abdominal pain.  Genitourinary: Positive for dysuria and discharge.  All other systems reviewed and are negative.     Allergies  Review of patient's allergies indicates no known allergies.  Home Medications   Prior to Admission medications   Medication Sig Start Date End Date Taking? Authorizing Provider  azithromycin (ZITHROMAX) 250 MG tablet Take 1 tablet (250 mg total) by mouth daily. 02/20/14   Fayrene HelperBowie Tran, PA-C  dextromethorphan-guaiFENesin [facility](MUCINEX DM) 30-600 MG per 12 hr tablet Take 1 tablet by mouth 2 (two) times daily as needed for cough.    Historical Provider, MD  HYDROcodone-acetaminophen (HYCET) 7.5-325 mg/15 ml  solution Take 10 mLs by mouth 4 (four) times daily as needed for moderate pain. 02/20/14   Fayrene HelperBowie Tran, PA-C  Pseudoephedrine-Ibuprofen (ADVIL COLD/SINUS) 30-200 MG TABS Take 2 capsules by mouth daily as needed (for cold).    Historical Provider, MD   BP 121/67  Pulse 63  Temp(Src) 98.8 F (37.1 C) (Oral)  Ht 5\' 10"  (1.778 m)  Wt 190 lb (86.183 kg)  BMI 27.26 kg/m2  SpO2 98% Physical Exam  Nursing note and vitals reviewed. Constitutional: He is oriented to person, place, and time. He appears well-developed and well-nourished. No distress.  HENT:  Head: Normocephalic and atraumatic.  Right Ear: External ear normal.  Left Ear: External ear normal.  Nose: Nose normal.  Eyes: Conjunctivae and EOM are normal.  Neck: Normal range of motion. No tracheal deviation present.  Cardiovascular: Normal rate, regular rhythm and normal heart sounds.   Pulmonary/Chest: Effort normal and breath sounds normal. No stridor.  Abdominal: Soft. He exhibits no distension. There is no tenderness.  Genitourinary: Testes normal and penis normal. Right testis shows no mass, no swelling and no tenderness. Left testis shows no mass, no swelling and no tenderness. Circumcised. No penile erythema or penile tenderness. No discharge found.  Musculoskeletal: Normal range of motion.  Neurological: He is alert and oriented to person, place, and time.  Skin: Skin is warm and dry. He is not diaphoretic.  Psychiatric: He has a normal mood and affect. His behavior is normal.    ED Course  Procedures (including  critical care time)  DIAGNOSTIC STUDIES: Oxygen Saturation is 98% on room air, normal by my interpretation.    COORDINATION OF CARE: 1:27 PM- Discussed treating the patient with Rocephin and Azithromycin.  Will call patient with STD test results.  The patient agreed to the treatment plan.  Labs Review Labs Reviewed  GC/CHLAMYDIA PROBE AMP  HIV ANTIBODY (ROUTINE TESTING)  RPR    Imaging Review No results  found.   EKG Interpretation None      MDM   Final diagnoses:  Penile discharge    Patient presents to ED with penile discharge. Treated for GC/Chlamydia in ED. Discussed that GC/Chlamydia, HIV, and RPR do not come back today and he will receive a phone call with positive results. Encouraged to follow [facility]. Discussed reasons to return to ED immediately. Vital signs stable for discharge. Patient / Family / Caregiver informed of clinical course, understand medical decision-making process, and agree with plan.   I personally performed the services described in this documentation, which was scribed in my presence. The recorded information has been reviewed and is accurate.    Mora BellmanHannah S Yaeko Fazekas, PA-C 09/05/14 [phone]

## 2014-09-05 NOTE — ED Notes (Signed)
Declined W/C at D/C and was escorted to lobby by RN. 

## 2014-09-05 NOTE — ED Notes (Signed)
Pt reports penile discharge x 2 days, concerned for STD. Denies dysuria, denies flank pain. Pt in NAD.

## 2014-09-06 LAB — GC/CHLAMYDIA PROBE AMP
CT Probe RNA: NEGATIVE
GC Probe RNA: NEGATIVE

## 2014-09-06 NOTE — ED Provider Notes (Signed)
Medical screening examination/treatment/procedure(s) were performed by non-physician practitioner and as supervising physician I was immediately available for consultation/collaboration.   EKG Interpretation None        David H Yao, MD 09/06/14 1918 

## 2014-12-18 ENCOUNTER — Encounter (HOSPITAL_COMMUNITY): Payer: Self-pay | Admitting: Emergency Medicine

## 2014-12-18 ENCOUNTER — Emergency Department (HOSPITAL_COMMUNITY)
Admission: EM | Admit: 2014-12-18 | Discharge: 2014-12-18 | Disposition: A | Discharge status: Home / Self Care | Payer: Self-pay | Attending: Emergency Medicine | Admitting: Emergency Medicine

## 2014-12-18 DIAGNOSIS — J069 Acute upper respiratory infection, unspecified: Secondary | ICD-10-CM | POA: Insufficient documentation

## 2014-12-18 DIAGNOSIS — Z792 Long term (current) use of antibiotics: Secondary | ICD-10-CM | POA: Insufficient documentation

## 2014-12-18 DIAGNOSIS — Z720 Tobacco use: Secondary | ICD-10-CM | POA: Insufficient documentation

## 2014-12-18 NOTE — Discharge Instructions (Signed)
Upper Respiratory Infection, Adult An upper respiratory infection (URI) is also sometimes known as the common cold. The upper respiratory tract includes the nose, sinuses, throat, trachea, and bronchi. Bronchi are the airways leading to the lungs. Most people improve within 1 week, but symptoms can last up to 2 weeks. A residual cough may last even longer.  CAUSES Many different viruses can infect the tissues lining the upper respiratory tract. The tissues become irritated and inflamed and often become very moist. Mucus production is also common. A cold is contagious. You can easily spread the virus to others by oral contact. This includes kissing, sharing a glass, coughing, or sneezing. Touching your mouth or nose and then touching a surface, which is then touched by another person, can also spread the virus. SYMPTOMS  Symptoms typically develop 1 to 3 days after you come in contact with a cold virus. Symptoms vary from person to person. They may include:  Runny nose.  Sneezing.  Nasal congestion.  Sinus irritation.  Sore throat.  Loss of voice (laryngitis).  Cough.  Fatigue.  Muscle aches.  Loss of appetite.  Headache.  Low-grade fever. DIAGNOSIS  You might diagnose your own cold based on familiar symptoms, since most people get a cold 2 to 3 times a year. Your caregiver can confirm this based on your exam. Most importantly, your caregiver can check that your symptoms are not due to another disease such as strep throat, sinusitis, pneumonia, asthma, or epiglottitis. Blood tests, throat tests, and X-rays are not necessary to diagnose a common cold, but they may sometimes be helpful in excluding other more serious diseases. Your caregiver will decide if any further tests are required. RISKS AND COMPLICATIONS  You may be at risk for a more severe case of the common cold if you smoke cigarettes, have chronic heart disease (such as heart failure) or lung disease (such as asthma), or if  you have a weakened immune system. The very young and very old are also at risk for more serious infections. Bacterial sinusitis, middle ear infections, and bacterial pneumonia can complicate the common cold. The common cold can worsen asthma and chronic obstructive pulmonary disease (COPD). Sometimes, these complications can require emergency medical care and may be life-threatening. PREVENTION  The best way to protect against getting a cold is to practice good hygiene. Avoid oral or hand contact with people with cold symptoms. Wash your hands often if contact occurs. There is no clear evidence that vitamin C, vitamin E, echinacea, or exercise reduces the chance of developing a cold. However, it is always recommended to get plenty of rest and practice good nutrition. TREATMENT  Treatment is directed at relieving symptoms. There is no cure. Antibiotics are not effective, because the infection is caused by a virus, not by bacteria. Treatment may include:  Increased fluid intake. Sports drinks offer valuable electrolytes, sugars, and fluids.  Breathing heated mist or steam (vaporizer or shower).  Eating chicken soup or other clear broths, and maintaining good nutrition.  Getting plenty of rest.  Using gargles or lozenges for comfort.  Controlling fevers with ibuprofen or acetaminophen as directed by your caregiver.  Increasing usage of your inhaler if you have asthma. Zinc gel and zinc lozenges, taken in the first 24 hours of the common cold, can shorten the duration and lessen the severity of symptoms. Pain medicines may help with fever, muscle aches, and throat pain. A variety of non-prescription medicines are available to treat congestion and runny nose. Your caregiver   can make recommendations and may suggest nasal or lung inhalers for other symptoms.  HOME CARE INSTRUCTIONS   Only take over-the-counter or prescription medicines for pain, discomfort, or fever as directed by your  caregiver.  Use a warm mist humidifier or inhale steam from a shower to increase air moisture. This may keep secretions moist and make it easier to breathe.  Drink enough water and fluids to keep your urine clear or pale yellow.  Rest as needed.  Return to work when your temperature has returned to normal or as your caregiver advises. You may need to stay home longer to avoid infecting others. You can also use a face mask and careful hand washing to prevent spread of the virus. SEEK MEDICAL CARE IF:   After the first few days, you feel you are getting worse rather than better.  You need your caregiver's advice about medicines to control symptoms.  You develop chills, worsening shortness of breath, or brown or red sputum. These may be signs of pneumonia.  You develop yellow or brown nasal discharge or pain in the face, especially when you bend forward. These may be signs of sinusitis.  You develop a fever, swollen neck glands, pain with swallowing, or white areas in the back of your throat. These may be signs of strep throat. SEEK IMMEDIATE MEDICAL CARE IF:   You have a fever.  You develop severe or persistent headache, ear pain, sinus pain, or chest pain.  You develop wheezing, a prolonged cough, cough up blood, or have a change in your usual mucus (if you have chronic lung disease).  You develop sore muscles or a stiff neck. Document Released: 05/04/2001 Document Revised: 01/31/2012 Document Reviewed: 02/13/2014 [employer] Patient Information 2015 [employer]. This information is not intended to replace advice given to you by your health care provider. Make sure you discuss any questions you have with your health care provider.  

## 2014-12-18 NOTE — ED Provider Notes (Addendum)
CSN: [id number]     Arrival date & time 12/18/14  4098 History   First MD Initiated Contact with Patient 12/18/14 [phone]     Chief Complaint  Patient presents with  . Influenza     (Consider location/radiation/quality/duration/timing/severity/associated sxs/prior Treatment) Patient is a 39 y.o. male presenting with flu symptoms. The history is provided by the patient.  Influenza Presenting symptoms: cough and rhinorrhea   Presenting symptoms: no nausea, no shortness of breath, no sore throat and no vomiting   Severity:  Moderate Onset quality:  Gradual Duration:  3 days Progression:  Unchanged Chronicity:  New Relieved by:  None tried Worsened by:  Nothing tried Ineffective treatments:  None tried Associated symptoms: chills and nasal congestion   Associated symptoms: no decreased appetite and no ear pain   Risk factors: sick contacts   Risk factors: no diabetes problem and no immunocompromised state     History reviewed. No pertinent past medical history. History reviewed. No pertinent past surgical history. No family history on file. History  Substance Use Topics  . Smoking status: Current Every Day Smoker -- 1.00 packs/day    Types: Cigarettes  . Smokeless tobacco: Not on file  . Alcohol Use: Yes    Review of Systems  Constitutional: Positive for chills. Negative for decreased appetite.  HENT: Positive for congestion and rhinorrhea. Negative for ear pain and sore throat.   Respiratory: Positive for cough. Negative for shortness of breath.   Gastrointestinal: Negative for nausea and vomiting.  All other systems reviewed and are negative.     Allergies  Review of patient's allergies indicates no known allergies.  Home Medications   Prior to Admission medications   Medication Sig Start Date End Date Taking? Authorizing Provider  azithromycin (ZITHROMAX) 250 MG tablet Take 1 tablet (250 mg total) by mouth daily. 02/20/14   Fayrene Helper, PA-C   dextromethorphan-guaiFENesin [facility] DM) 30-600 MG per 12 hr tablet Take 1 tablet by mouth 2 (two) times daily as needed for cough.    Historical Provider, MD  HYDROcodone-acetaminophen (HYCET) 7.5-325 mg/15 ml solution Take 10 mLs by mouth 4 (four) times daily as needed for moderate pain. 02/20/14   Fayrene Helper, PA-C  Pseudoephedrine-Ibuprofen (ADVIL COLD/SINUS) 30-200 MG TABS Take 2 capsules by mouth daily as needed (for cold).    Historical Provider, MD   BP 123/60 mmHg  Pulse 71  Temp(Src) 98.1 F (36.7 C) (Oral)  Resp 14  SpO2 98% Physical Exam  Constitutional: He is oriented to person, place, and time. He appears well-developed and well-nourished. No distress.  HENT:  Head: Normocephalic and atraumatic.  Right Ear: Tympanic membrane and ear canal normal.  Left Ear: Tympanic membrane and ear canal normal.  Nose: Mucosal edema and rhinorrhea present.  Mouth/Throat: Posterior oropharyngeal erythema present. No oropharyngeal exudate or posterior oropharyngeal edema.  Eyes: Conjunctivae and EOM are normal. Pupils are equal, round, and reactive to light.  Neck: Normal range of motion. Neck supple.  Cardiovascular: Normal rate, regular rhythm and intact distal pulses.   No murmur heard. Pulmonary/Chest: Effort normal and breath sounds normal. No respiratory distress. He has no wheezes. He has no rales.  Abdominal: Soft. He exhibits no distension. There is no tenderness. There is no rebound and no guarding.  Musculoskeletal: Normal range of motion. He exhibits no edema or tenderness.  Neurological: He is alert and oriented to person, place, and time.  Skin: Skin is warm and dry. No rash noted. No erythema.  Psychiatric: He has a  normal mood and affect. His behavior is normal.  Nursing note and vitals reviewed.   ED Course  Procedures (including critical care time) Labs Review Labs Reviewed - No data to display  Imaging Review No results found.   EKG Interpretation None       MDM   Final diagnoses:  URI (upper respiratory infection)    Pt with symptoms consistent with viral URI.  Well appearing here.  No signs of breathing difficulty  No signs of pharyngitis, otitis or abnormal abdominal findings.  Lungs are clear.  pt to return with any further problems.     Gwyneth SproutWhitney Nishant Schrecengost, MD 12/18/14 [id number]  Gwyneth SproutWhitney Jahquan Klugh, MD 12/18/14 [phone]

## 2014-12-18 NOTE — ED Notes (Signed)
Pt presents to the department with generalized body aches, fatigue, sore throat, chills, and a productive cough. Pt reports he coughed up a large amount of yellow sputum this morning. A&O X4.

## 2016-01-01 ENCOUNTER — Encounter (HOSPITAL_COMMUNITY): Payer: Self-pay | Admitting: *Deleted

## 2016-01-01 ENCOUNTER — Emergency Department (HOSPITAL_COMMUNITY)
Admission: EM | Admit: 2016-01-01 | Discharge: 2016-01-01 | Disposition: A | Discharge status: Home / Self Care | Payer: Self-pay | Attending: Emergency Medicine | Admitting: Emergency Medicine

## 2016-01-01 DIAGNOSIS — Z792 Long term (current) use of antibiotics: Secondary | ICD-10-CM | POA: Insufficient documentation

## 2016-01-01 DIAGNOSIS — F17210 Nicotine dependence, cigarettes, uncomplicated: Secondary | ICD-10-CM | POA: Insufficient documentation

## 2016-01-01 DIAGNOSIS — R300 Dysuria: Secondary | ICD-10-CM | POA: Insufficient documentation

## 2016-01-01 DIAGNOSIS — Z202 Contact with and (suspected) exposure to infections with a predominantly sexual mode of transmission: Secondary | ICD-10-CM | POA: Insufficient documentation

## 2016-01-01 MED ORDER — AZITHROMYCIN 250 MG PO TABS
1000.0000 mg | ORAL_TABLET | Freq: Once | ORAL | Status: AC
  Administered 2016-01-01: 1000 mg via ORAL
  Filled 2016-01-01: qty 4

## 2016-01-01 MED ORDER — CEFTRIAXONE SODIUM 250 MG IJ SOLR
250.0000 mg | Freq: Once | INTRAMUSCULAR | Status: AC
  Administered 2016-01-01: 250 mg via INTRAMUSCULAR
  Filled 2016-01-01: qty 250

## 2016-01-01 MED ORDER — STERILE WATER FOR INJECTION IJ SOLN
INTRAMUSCULAR | Status: AC
  Administered 2016-01-01: 1.2 mL
  Filled 2016-01-01: qty 10

## 2016-01-01 NOTE — Discharge Instructions (Signed)
Sexually Transmitted Disease  A sexually transmitted disease (STD) is a disease or infection that may be passed (transmitted) from person to person, usually during sexual activity. This may happen by way of saliva, semen, blood, vaginal mucus, or urine. Common STDs include:  · Gonorrhea.  · Chlamydia.  · Syphilis.  · HIV and AIDS.  · Genital herpes.  · Hepatitis B and C.  · Trichomonas.  · Human papillomavirus (HPV).  · Pubic lice.  · Scabies.  · Mites.  · Bacterial vaginosis.  WHAT ARE CAUSES OF STDs?  An STD may be caused by bacteria, a virus, or parasites. STDs are often transmitted during sexual activity if one person is infected. However, they may also be transmitted through nonsexual means. STDs may be transmitted after:   · Sexual intercourse with an infected person.  · Sharing sex toys with an infected person.  · Sharing needles with an infected person or using unclean piercing or tattoo needles.  · Having intimate contact with the genitals, mouth, or rectal areas of an infected person.  · Exposure to infected fluids during birth.  WHAT ARE THE SIGNS AND SYMPTOMS OF STDs?  Different STDs have different symptoms. Some people may not have any symptoms. If symptoms are present, they may include:  · Painful or bloody urination.  · Pain in the pelvis, abdomen, vagina, anus, throat, or eyes.  · A skin rash, itching, or irritation.  · Growths, ulcerations, blisters, or sores in the genital and anal areas.  · Abnormal vaginal discharge with or without bad odor.  · Penile discharge in men.  · Fever.  · Pain or bleeding during sexual intercourse.  · Swollen glands in the groin area.  · Yellow skin and eyes (jaundice). This is seen with hepatitis.  · Swollen testicles.  · Infertility.  · Sores and blisters in the mouth.  HOW ARE STDs DIAGNOSED?  To make a diagnosis, your health care provider may:  · Take a medical history.  · Perform a physical exam.  · Take a sample of any discharge to examine.  · Swab the throat,  cervix, opening to the penis, rectum, or vagina for testing.  · Test a sample of your first morning urine.  · Perform blood tests.  · Perform a Pap test, if this applies.  · Perform a colposcopy.  · Perform a laparoscopy.  HOW ARE STDs TREATED?  Treatment depends on the STD. Some STDs may be treated but not cured.  · Chlamydia, gonorrhea, trichomonas, and syphilis can be cured with antibiotic medicine.  · Genital herpes, hepatitis, and HIV can be treated, but not cured, with prescribed medicines. The medicines lessen symptoms.  · Genital warts from HPV can be treated with medicine or by freezing, burning (electrocautery), or surgery. Warts may come back.  · HPV cannot be cured with medicine or surgery. However, abnormal areas may be removed from the cervix, vagina, or vulva.  · If your diagnosis is confirmed, your recent sexual partners need treatment. This is true even if they are symptom-free or have a negative culture or evaluation. They should not have sex until their health care providers say it is okay.  · Your health care provider may test you for infection again 3 months after treatment.  HOW CAN I REDUCE MY RISK OF GETTING AN STD?  Take these steps to reduce your risk of getting an STD:  · Use latex condoms, dental dams, and water-soluble lubricants during sexual activity. Do not use   petroleum jelly or oils.  · Avoid having multiple sex partners.  · Do not have sex with someone who has other sex partners  · Do not have sex with anyone you do not know or who is at high risk for an STD.  · Avoid risky sex practices that can break your skin.  · Do not have sex if you have open sores on your mouth or skin.  · Avoid drinking too much alcohol or taking illegal drugs. Alcohol and drugs can affect your judgment and put you in a vulnerable position.  · Avoid engaging in oral and anal sex acts.  · Get vaccinated for HPV and hepatitis. If you have not received these vaccines in the past, talk to your health care  provider about whether one or both might be right for you.  · If you are at risk of being infected with HIV, it is recommended that you take a prescription medicine daily to prevent HIV infection. This is called pre-exposure prophylaxis (PrEP). You are considered at risk if:    You are a man who has sex with other men (MSM).    You are a heterosexual man or woman and are sexually active with more than one partner.    You take drugs by injection.    You are sexually active with a partner who has HIV.  · Talk with your health care provider about whether you are at high risk of being infected with HIV. If you choose to begin PrEP, you should first be tested for HIV. You should then be tested every 3 months for as long as you are taking PrEP.  WHAT SHOULD I DO IF I THINK I HAVE AN STD?  · See your health care provider.  · Tell your sexual partner(s). They should be tested and treated for any STDs.  · Do not have sex until your health care provider says it is okay.  WHEN SHOULD I GET IMMEDIATE MEDICAL CARE?  Contact your health care provider right away if:   · You have severe abdominal pain.  · You are a man and notice swelling or pain in your testicles.  · You are a woman and notice swelling or pain in your vagina.     This information is not intended to replace advice given to you by your health care provider. Make sure you discuss any questions you have with your health care provider.     Document Released: 01/29/2003 Document Revised: 11/29/2014 Document Reviewed: 05/29/2013  Elsevier Interactive Patient Education ©2016 [employer]    Safe Sex  Safe sex is about reducing the risk of giving or getting a sexually transmitted disease (STD). STDs are spread through sexual contact involving the genitals, mouth, or rectum. Some STDs can be cured and others cannot. Safe sex can also prevent unintended pregnancies.   WHAT ARE SOME SAFE SEX PRACTICES?  · Limit your sexual activity to only one partner who is having sex with  only you.  · Talk to your partner about his or her past partners, past STDs, and drug use.  · Use a condom every time you have sexual intercourse. This includes vaginal, oral, and anal sexual activity. Both females and males should wear condoms during oral sex. Only use latex or polyurethane condoms and water-based lubricants. Using petroleum-based lubricants or oils to lubricate a condom will weaken the condom and increase the chance that it will break. The condom should be in place from the beginning to   the end of sexual activity. Wearing a condom reduces, but does not completely eliminate, your risk of getting or giving an STD. STDs can be spread by contact with infected body fluids and skin.  · Get vaccinated for hepatitis B and HPV.  · Avoid alcohol and recreational drugs, which can affect your judgment. You may forget to use a condom or participate in high-risk sex.  · For females, avoid douching after sexual intercourse. Douching can spread an infection farther into the reproductive tract.  · Check your body for signs of sores, blisters, rashes, or unusual discharge. See your health care provider if you notice any of these signs.  · Avoid sexual contact if you have symptoms of an infection or are being treated for an STD. If you or your partner has herpes, avoid sexual contact when blisters are present. Use condoms at all other times.  · If you are at risk of being infected with HIV, it is recommended that you take a prescription medicine daily to prevent HIV infection. This is called pre-exposure prophylaxis (PrEP). You are considered at risk if:    You are a man who has sex with other men (MSM).    You are a heterosexual man or woman who is sexually active with more than one partner.    You take drugs by injection.    You are sexually active with a partner who has HIV.  · Talk with your health care provider about whether you are at high risk of being infected with HIV. If you choose to begin PrEP, you  should first be tested for HIV. You should then be tested every 3 months for as long as you are taking PrEP.  · See your health care provider for regular screenings, exams, and tests for other STDs. Before having sex with a new partner, each of you should be screened for STDs and should talk about the results with each other.  WHAT ARE THE BENEFITS OF SAFE SEX?   · There is less chance of getting or giving an STD.  · You can prevent unwanted or unintended pregnancies.  · By discussing safe sex concerns with your partner, you may increase feelings of intimacy, comfort, trust, and honesty between the two of you.     This information is not intended to replace advice given to you by your health care provider. Make sure you discuss any questions you have with your health care provider.     Document Released: 12/16/2004 Document Revised: 11/29/2014 Document Reviewed: 05/01/2012  Elsevier Interactive Patient Education ©2016 Elsevier Inc[employer]

## 2016-01-01 NOTE — ED Provider Notes (Signed)
CSN: [id number]     Arrival date & time 01/01/16  2032 History  By signing my name below, I, Bethel Born, attest that this documentation has been prepared under the direction and in the presence of Juwaun Inskeep PA-C. Electronically Signed: Bethel Born, ED Scribe. 01/01/2016 9:15 PM   Chief Complaint  Patient presents with  . Penile Discharge    The history is provided by the patient. No language interpreter was used.   WENDELIN BRADT is a 40 y.o. male who presents to the Emergency Department complaining of new penile discharge with onset 2 days ago. Discharge is yellow without blood. Associated symptoms include dysuria. Pt denies fever, chills, abdominal pain, penile pain, penile swelling, testicular pain, testicular swelling, painful bowel movements or painful ejaculation. He has had known gonorrhea exposure as his partner was notified this morning that she tested positive. He would like HIV testing.    History reviewed. No pertinent past medical history. History reviewed. No pertinent past surgical history. History reviewed. No pertinent family history. Social History  Substance Use Topics  . Smoking status: Current Every Day Smoker -- 1.00 packs/day    Types: Cigarettes  . Smokeless tobacco: None  . Alcohol Use: Yes    Review of Systems  Genitourinary: Positive for dysuria and discharge.  All other systems reviewed and are negative.     Allergies  Review of patient's allergies indicates no known allergies.  Home Medications   Prior to Admission medications   Medication Sig Start Date End Date Taking? Authorizing Provider  azithromycin (ZITHROMAX) 250 MG tablet Take 1 tablet (250 mg total) by mouth daily. 02/20/14   Fayrene Helper, PA-C  dextromethorphan-guaiFENesin [facility] DM) 30-600 MG per 12 hr tablet Take 1 tablet by mouth 2 (two) times daily as needed for cough.    Historical Provider, MD  HYDROcodone-acetaminophen (HYCET) 7.5-325 mg/15 ml solution Take 10 mLs by  mouth 4 (four) times daily as needed for moderate pain. 02/20/14   Fayrene Helper, PA-C  Pseudoephedrine-Ibuprofen (ADVIL COLD/SINUS) 30-200 MG TABS Take 2 capsules by mouth daily as needed (for cold).    Historical Provider, MD   BP 144/84 mmHg  Pulse 67  Temp(Src) 98.5 F (36.9 C) (Oral)  Resp 20  SpO2 100% Physical Exam  Constitutional: He appears well-developed and well-nourished. No distress.  HENT:  Head: Normocephalic and atraumatic.  Right Ear: External ear normal.  Left Ear: External ear normal.  Eyes: Conjunctivae are normal. Right eye exhibits no discharge. Left eye exhibits no discharge. No scleral icterus.  Neck: Normal range of motion.  Cardiovascular: Normal rate and regular rhythm.   Pulmonary/Chest: Effort normal. No respiratory distress.  Abdominal: Soft. There is no tenderness.  Genitourinary: Testes normal. Right testis shows no swelling and no tenderness. Left testis shows no swelling and no tenderness. Circumcised. Discharge found.  Musculoskeletal: Normal range of motion.  Moves all extremities spontaneously  Lymphadenopathy:       Right: No inguinal adenopathy present.       Left: No inguinal adenopathy present.  Neurological: He is alert. Coordination normal.  Skin: Skin is warm and dry.  Psychiatric: He has a normal mood and affect. His behavior is normal.  Nursing note and vitals reviewed.   ED Course  Procedures (including critical care time) DIAGNOSTIC STUDIES: Oxygen Saturation is 100% on RA,  normal by my interpretation.    COORDINATION OF CARE: 9:02 PM Discussed treatment plan which includes lab work and abx with pt at bedside and pt agreed  to plan.  Labs Review Labs Reviewed  RPR  HIV ANTIBODY (ROUTINE TESTING)  GC/CHLAMYDIA PROBE AMP ([place]) NOT AT [facility]    Imaging Review No results found. I have personally reviewed and evaluated these lab results as part of my medical decision-making.   EKG Interpretation None      MDM    Final diagnoses:  STD exposure   Patient presenting with penile discharge with known STD exposure to gonorrhea. Patient is afebrile without abdominal tenderness, abdominal pain or painful bowel movements to indicate prostatitis.  No tenderness to palpation of the testes or epididymis to suggest orchitis or epididymitis.  STD cultures obtained including HIV, syphilis, gonorrhea and chlamydia. Patient has been treated prophylactically with azithromycin and Rocephin. Patient to be discharged with instructions to follow up with PCP. Discussed importance of using protection when sexually active. Pt understands that they have GC/Chlamydia cultures pending and that they will need to inform all sexual partners if results return positive. Return precautions given in discharge paperwork and discussed with pt at bedside. Pt stable for discharge  I personally performed the services described in this documentation, which was scribed in my presence. The recorded information has been reviewed and is accurate.    Rolm Gala Cobin Cadavid, PA-C 01/01/16 2142  Lyndal Pulley, MD 01/02/16 [phone]

## 2016-01-01 NOTE — ED Notes (Signed)
Pt in c/o penile discharge for the last two days, also burning sensation, states he had a known exposure to gonorrhea.

## 2016-01-02 LAB — RPR: RPR: NONREACTIVE

## 2016-01-02 LAB — GC/CHLAMYDIA PROBE AMP (~~LOC~~) NOT AT ARMC
Chlamydia: POSITIVE — AB
Neisseria Gonorrhea: POSITIVE — AB

## 2016-01-02 LAB — HIV ANTIBODY (ROUTINE TESTING W REFLEX): HIV SCREEN 4TH GENERATION: NONREACTIVE

## 2016-01-05 ENCOUNTER — Telehealth (HOSPITAL_COMMUNITY): Payer: Self-pay

## 2016-01-05 NOTE — Telephone Encounter (Signed)
Positive for gonorrhea and chlamydia.Treated per protocol.   [facility] form  faxed. Attempting to contact. Unable to contact pt by mail or telephone. Unable to communicate lab results or treatment changes.

## 2016-06-30 ENCOUNTER — Encounter (HOSPITAL_COMMUNITY): Payer: Self-pay | Admitting: *Deleted

## 2016-06-30 ENCOUNTER — Emergency Department (HOSPITAL_COMMUNITY)
Admission: EM | Admit: 2016-06-30 | Discharge: 2016-06-30 | Disposition: A | Discharge status: Home / Self Care | Payer: Self-pay | Attending: Emergency Medicine | Admitting: Emergency Medicine

## 2016-06-30 DIAGNOSIS — R300 Dysuria: Secondary | ICD-10-CM | POA: Insufficient documentation

## 2016-06-30 DIAGNOSIS — F17210 Nicotine dependence, cigarettes, uncomplicated: Secondary | ICD-10-CM | POA: Insufficient documentation

## 2016-06-30 DIAGNOSIS — Z202 Contact with and (suspected) exposure to infections with a predominantly sexual mode of transmission: Secondary | ICD-10-CM | POA: Insufficient documentation

## 2016-06-30 LAB — URINALYSIS, ROUTINE W REFLEX MICROSCOPIC
BILIRUBIN URINE: NEGATIVE
Glucose, UA: NEGATIVE mg/dL
HGB URINE DIPSTICK: NEGATIVE
Ketones, ur: NEGATIVE mg/dL
Leukocytes, UA: NEGATIVE
Nitrite: NEGATIVE
PH: 6 (ref 5.0–8.0)
Protein, ur: NEGATIVE mg/dL
SPECIFIC GRAVITY, URINE: 1.028 (ref 1.005–1.030)

## 2016-06-30 MED ORDER — STERILE WATER FOR INJECTION IJ SOLN
INTRAMUSCULAR | Status: AC
  Administered 2016-06-30: 10 mL
  Filled 2016-06-30: qty 10

## 2016-06-30 MED ORDER — CEFTRIAXONE SODIUM 250 MG IJ SOLR
250.0000 mg | Freq: Once | INTRAMUSCULAR | Status: AC
  Administered 2016-06-30: 250 mg via INTRAMUSCULAR
  Filled 2016-06-30: qty 250

## 2016-06-30 MED ORDER — AZITHROMYCIN 1 G PO PACK: 1.0000 g | PACK | Freq: Once | ORAL | Status: DC

## 2016-06-30 MED ORDER — AZITHROMYCIN 250 MG PO TABS
1000.0000 mg | ORAL_TABLET | Freq: Once | ORAL | Status: AC
  Administered 2016-06-30: 1000 mg via ORAL
  Filled 2016-06-30: qty 4

## 2016-06-30 NOTE — ED Notes (Signed)
Registration at bedside.

## 2016-06-30 NOTE — ED Triage Notes (Signed)
Pt states that he has pain after urination. States he also has penile discharge. States he had similar symptoms from a UTI. Denies unprotected sex.

## 2016-06-30 NOTE — ED Provider Notes (Signed)
[facility]-EMERGENCY DEPT Provider Note   CSN: [id number] Arrival date & time: 06/30/16  1658  First Provider Contact:  6:15 PM   By signing my name below, I, John Trevino, attest that this documentation has been prepared under the direction and in the presence of [facility] Neese NP.  Electronically Signed: Vista Minkobert Trevino, ED Scribe. 06/30/16. 6:26 PM.   History   Chief Complaint Chief Complaint  Patient presents with  . Urinary Tract Infection    HPI HPI Comments: John Trevino is a 40 y.o. male who presents to the Emergency Department complaining of penile discharge and pain after urination; onset 3-4 weeks ago. Pt reports a "tingling" sensation after urination; denies pain during urination. Pt also complains of intermittent penile discharge that he notes after urination. Pt reports Hx of similar symptoms when he had Gonorrhea a few years ago. Pt's last sexual intercourse approximately 3 weeks ago; without using protection. Pt reports that this encounter was with his current partner. Denies fever.   The history is provided by the patient. No language interpreter was used.  Dysuria     History reviewed. No pertinent past medical history.  There are no active problems to display for this patient.   History reviewed. No pertinent surgical history.     Home Medications    Prior to Admission medications   Medication Sig Start Date End Date Taking? Authorizing Provider  azithromycin (ZITHROMAX) 250 MG tablet Take 1 tablet (250 mg total) by mouth daily. 02/20/14   Fayrene HelperBowie Tran, PA-C  dextromethorphan-guaiFENesin [facility](MUCINEX DM) 30-600 MG per 12 hr tablet Take 1 tablet by mouth 2 (two) times daily as needed for cough.    Historical Provider, MD  HYDROcodone-acetaminophen (HYCET) 7.5-325 mg/15 ml solution Take 10 mLs by mouth 4 (four) times daily as needed for moderate pain. 02/20/14   Fayrene HelperBowie Tran, PA-C  Pseudoephedrine-Ibuprofen (ADVIL COLD/SINUS) 30-200 MG TABS Take 2 capsules by mouth daily as  needed (for cold).    Historical Provider, MD    Family History No family history on file.  Social History Social History  Substance Use Topics  . Smoking status: Current Every Day Smoker    Packs/day: 1.00    Types: Cigarettes  . Smokeless tobacco: Never Used  . Alcohol use Yes     Allergies   Review of patient's allergies indicates no known allergies.   Review of Systems Review of Systems  Constitutional: Negative for fever.  Genitourinary: Positive for discharge and dysuria (after urination).  All other systems reviewed and are negative.    Physical Exam Updated Vital Signs BP 144/80 (BP Location: Right Arm)   Pulse 62   Temp 98.4 F (36.9 C) (Oral)   Resp 20   SpO2 100%   Physical Exam  Constitutional: He is oriented to person, place, and time. He appears well-developed and well-nourished. No distress.  HENT:  Head: Normocephalic and atraumatic.  Neck: Normal range of motion.  Pulmonary/Chest: Effort normal.  Genitourinary:  Genitourinary Comments: Circumcised male. No inguinal nodes. Normal scrotum. Small amount of clear discharge from the penis.  Neurological: He is alert and oriented to person, place, and time.  Skin: Skin is warm and dry. He is not diaphoretic.  Psychiatric: He has a normal mood and affect. Judgment normal.  Nursing note and vitals reviewed.    ED Treatments / Results  DIAGNOSTIC STUDIES: Oxygen Saturation is 99% on RA, normal by my interpretation.  COORDINATION OF CARE: 6:11 PM-Will order cultures. Discussed treatment plan with pt at  bedside and pt agreed to plan.   Labs (all labs ordered are listed, but only abnormal results are displayed) Labs Reviewed  URINALYSIS, ROUTINE W REFLEX MICROSCOPIC (NOT AT [facility])  RPR  HIV ANTIBODY (ROUTINE TESTING)  GC/CHLAMYDIA PROBE AMP () NOT AT [facility]    Radiology No results found.  Procedures Procedures (including critical care time)  Medications Ordered in ED Medications   cefTRIAXone (ROCEPHIN) injection 250 mg (250 mg Intramuscular Given 06/30/16 1828)  sterile water (preservative free) injection (10 mLs  Given 06/30/16 1828)  azithromycin (ZITHROMAX) tablet 1,000 mg (1,000 mg Oral Given 06/30/16 1828)     Initial Impression / Assessment and Plan / ED Course  I have reviewed the triage vital signs and the nursing notes.  Pertinent lab results that were available during my care of the patient were reviewed by me and considered in my medical decision making (see chart for details).  Clinical Course   Discussed with the patient clinical and lab findings and plan of care and all questioned fully answered. He will f/u with the health department.    Final Clinical Impressions(s) / ED Diagnoses  40 y.o. male with dysuria and concern for STD stable for d/c without fever and does not appear toxic. Treated with Rocephin and Zithromax.   Final diagnoses:  Dysuria  Possible exposure to STD    New Prescriptions Discharge Medication List as of 06/30/2016  6:20 PM    I personally performed the services described in this documentation, which was scribed in my presence. The recorded information has been reviewed and is accurate.     [place], [place] 07/02/16 2312    Doug Sou, MD 07/03/16 [phone]

## 2016-06-30 NOTE — ED Notes (Signed)
Patient able to ambulate independently  

## 2016-07-01 LAB — RPR: RPR Ser Ql: NONREACTIVE

## 2016-07-01 LAB — HIV ANTIBODY (ROUTINE TESTING W REFLEX): HIV Screen 4th Generation wRfx: NONREACTIVE

## 2016-07-05 LAB — GC/CHLAMYDIA PROBE AMP (~~LOC~~) NOT AT ARMC
Chlamydia: NEGATIVE
Neisseria Gonorrhea: NEGATIVE

## 2016-09-17 ENCOUNTER — Emergency Department (HOSPITAL_COMMUNITY)
Admission: EM | Admit: 2016-09-17 | Discharge: 2016-09-17 | Disposition: A | Discharge status: Home / Self Care | Payer: Self-pay | Attending: Emergency Medicine | Admitting: Emergency Medicine

## 2016-09-17 ENCOUNTER — Encounter (HOSPITAL_COMMUNITY): Payer: Self-pay | Admitting: Nurse Practitioner

## 2016-09-17 DIAGNOSIS — F17210 Nicotine dependence, cigarettes, uncomplicated: Secondary | ICD-10-CM | POA: Insufficient documentation

## 2016-09-17 DIAGNOSIS — J029 Acute pharyngitis, unspecified: Secondary | ICD-10-CM | POA: Insufficient documentation

## 2016-09-17 LAB — RAPID STREP SCREEN (MED CTR MEBANE ONLY): STREPTOCOCCUS, GROUP A SCREEN (DIRECT): NEGATIVE

## 2016-09-17 NOTE — ED Provider Notes (Signed)
[facility] Provider Note   CSN: [id number] Arrival date & time: 09/17/16  1824  By signing my name below, I, Emmanuella Mensah, attest that this documentation has been prepared under the direction and in the presence of Kerrie Buffalo, NP. Electronically Signed: Angelene Giovanni, ED Scribe. 09/17/16. 7:18 PM.   History   Chief Complaint Chief Complaint  Patient presents with  . Sore Throat    HPI Comments: John Trevino is a 40 y.o. male who presents to the Emergency Department complaining of gradually worsening persistent 4/10 sore throat onset one month ago. He reports associated nausea, horse voice, persistnet non-productive cough, and intermittent wheezing. No alleviating factors noted. He states that he has been taking OTC cold medicines that alleviated his cold symptoms but not the sore throat. He has NKDA. He states that he has smoked less than a pack of cigarettes a day for many years. He denies a hx of Bronchitis. He also denies any fever, chills, recent unexpected weight changes, vomiting, ear pain, or any other symptoms   The history is provided by the patient. No language interpreter was used.    History reviewed. No pertinent past medical history.  There are no active problems to display for this patient.   History reviewed. No pertinent surgical history.     Home Medications    Prior to Admission medications   Medication Sig Start Date End Date Taking? Authorizing Provider  dextromethorphan-guaiFENesin (MUCINEX DM) 30-600 MG per 12 hr tablet Take 1 tablet by mouth 2 (two) times daily as needed for cough.   Yes Historical Provider, MD  Pseudoephedrine-Ibuprofen (ADVIL COLD/SINUS) 30-200 MG TABS Take 2 capsules by mouth daily as needed (for cold).   Yes Historical Provider, MD  azithromycin (ZITHROMAX) 250 MG tablet Take 1 tablet (250 mg total) by mouth daily. Patient not taking: Reported on 09/17/2016 02/20/14   Fayrene Helper, PA-C  HYDROcodone-acetaminophen  (HYCET) 7.5-325 mg/15 ml solution Take 10 mLs by mouth 4 (four) times daily as needed for moderate pain. Patient not taking: Reported on 09/17/2016 02/20/14   Fayrene Helper, PA-C    Family History History reviewed. No pertinent family history.  Social History Social History  Substance Use Topics  . Smoking status: Current Every Day Smoker    Packs/day: 1.00    Types: Cigarettes  . Smokeless tobacco: Never Used  . Alcohol use No     Allergies   Review of patient's allergies indicates no known allergies.   Review of Systems Review of Systems  Constitutional: Negative for chills, fever and unexpected weight change.  HENT: Positive for sore throat and voice change. Negative for ear pain.   Respiratory: Positive for cough and wheezing. Negative for shortness of breath.   Gastrointestinal: Positive for nausea. Negative for vomiting.  All other systems reviewed and are negative.    Physical Exam Updated Vital Signs BP 127/84 (BP Location: Left Arm)   Pulse 66   Temp 98.4 F (36.9 C) (Oral)   Resp 18   SpO2 98%   Physical Exam  Constitutional: He is oriented to person, place, and time. He appears well-developed and well-nourished. No distress.  HENT:  Head: Normocephalic and atraumatic.  Right Ear: Tympanic membrane normal.  Left Ear: Tympanic membrane normal.  Mouth/Throat: Uvula is midline. Posterior oropharyngeal erythema (mild) present. No posterior oropharyngeal edema.  No difficulty swallowing.   Eyes: Conjunctivae and EOM are normal.  Neck: Normal range of motion. Neck supple. No tracheal deviation present.  Cardiovascular: Normal rate, regular  rhythm and normal heart sounds.   Pulmonary/Chest: Effort normal. No respiratory distress. He has no wheezes. He has no rhonchi. He has no rales.  Lungs without wheezing, rales, or rhonchi  Abdominal: Soft. Bowel sounds are normal. There is no tenderness.  Musculoskeletal: Normal range of motion.  Neurological: He is alert and  oriented to person, place, and time.  Skin: Skin is warm and dry.  Psychiatric: He has a normal mood and affect. His behavior is normal.  Nursing note and vitals reviewed.    ED Treatments / Results  DIAGNOSTIC STUDIES: Oxygen Saturation is 98% on RA, normal by my interpretation.    COORDINATION OF CARE: 7:17 PM- Pt advised of plan for treatment and pt agrees. Pt will receive rapid strep screen for further evaluation. Will provide resources for ENT referral.    Labs (all labs ordered are listed, but only abnormal results are displayed) Labs Reviewed  RAPID STREP SCREEN (NOT AT [facility])  CULTURE, GROUP A STREP [facility])   Radiology No results found.  Procedures Procedures (including critical care time)  Medications Ordered in ED Medications - No data to display   Initial Impression / Assessment and Plan / ED Course  Kerrie BuffaloHope Neese, NP has reviewed the triage vital signs and the nursing notes.  Pertinent lab results that were available during my care of the patient were reviewed by me and considered in my medical decision making (see chart for details).  Clinical Course    Pt afebrile without tonsillar exudate, negative strep. Presents with mild cervical lymphadenopathy, & dysphagia. No abx indicated. DC w symptomatic tx for pain  Pt does not appear dehydrated, but did discuss importance of water rehydration. Presentation concerning due to the amount of time that the sore throat has been persistent and the fact that the patient has smoked every day for several years. Discussed with the patient need for follow up with ENT for further evaluation.  No trismus or uvula deviation. Specific return precautions discussed. Pt able to drink water in ED without difficulty with intact air way.   Final Clinical Impressions(s) / ED Diagnoses   Final diagnoses:  Sore throat    New Prescriptions Discharge Medication List as of 09/17/2016  7:55 PM     *I personally performed the services  described in this documentation, which was scribed in my presence. The recorded information has been reviewed and is accurate.    [address]Hope Hartwick [employer] Neese, NP 09/18/16 [id number]    Linwood DibblesJon Knapp, MD 09/19/16 1044

## 2016-09-17 NOTE — Discharge Instructions (Addendum)
Your strep screen today is negative. We have sent it for culture. Someone will call if you need additional medication.  Make an appointment with Dr. Pollyann Kennedyosen, ENT, for follow up.  Take tylenol and ibuprofen as needed and use chloraseptic spray.

## 2016-09-17 NOTE — ED Triage Notes (Signed)
Pt presents with c/o sore throat. He reports 1 month history of scratch, dry throat and hoarseness. This week he hasn't felt well and reports chills, nasal congestion. He is alert and breathing easily.

## 2016-09-20 LAB — CULTURE, GROUP A STREP (THRC)

## 2017-07-22 ENCOUNTER — Other Ambulatory Visit: Payer: Self-pay | Admitting: Otolaryngology

## 2017-09-18 ENCOUNTER — Emergency Department (HOSPITAL_COMMUNITY): Payer: BLUE CROSS/BLUE SHIELD

## 2017-09-18 ENCOUNTER — Emergency Department (HOSPITAL_COMMUNITY)
Admission: EM | Admit: 2017-09-18 | Discharge: 2017-09-19 | Disposition: A | Discharge status: Home / Self Care | Payer: BLUE CROSS/BLUE SHIELD | Attending: Emergency Medicine | Admitting: Emergency Medicine

## 2017-09-18 ENCOUNTER — Encounter (HOSPITAL_COMMUNITY): Payer: Self-pay | Admitting: Emergency Medicine

## 2017-09-18 DIAGNOSIS — R05 Cough: Secondary | ICD-10-CM | POA: Diagnosis present

## 2017-09-18 DIAGNOSIS — F17210 Nicotine dependence, cigarettes, uncomplicated: Secondary | ICD-10-CM | POA: Insufficient documentation

## 2017-09-18 DIAGNOSIS — J209 Acute bronchitis, unspecified: Secondary | ICD-10-CM

## 2017-09-18 DIAGNOSIS — R109 Unspecified abdominal pain: Secondary | ICD-10-CM | POA: Insufficient documentation

## 2017-09-18 LAB — BASIC METABOLIC PANEL
Anion gap: 8 (ref 5–15)
BUN: 14 mg/dL (ref 6–20)
CHLORIDE: 104 mmol/L (ref 101–111)
CO2: 25 mmol/L (ref 22–32)
CREATININE: 1.23 mg/dL (ref 0.61–1.24)
Calcium: 8.9 mg/dL (ref 8.9–10.3)
GFR calc Af Amer: >60 mL/min (ref >60)
GFR calc non Af Amer: >60 mL/min (ref >60)
Glucose, Bld: 95 mg/dL (ref 65–99)
Potassium: 3.6 mmol/L (ref 3.5–5.1)
Sodium: 137 mmol/L (ref 135–145)

## 2017-09-18 LAB — URINALYSIS, ROUTINE W REFLEX MICROSCOPIC
BILIRUBIN URINE: NEGATIVE
Glucose, UA: NEGATIVE mg/dL
Hgb urine dipstick: NEGATIVE
Ketones, ur: NEGATIVE mg/dL
Leukocytes, UA: NEGATIVE
NITRITE: NEGATIVE
PH: 7 (ref 5.0–8.0)
Protein, ur: NEGATIVE mg/dL
SPECIFIC GRAVITY, URINE: 1.017 (ref 1.005–1.030)

## 2017-09-18 LAB — CBC WITH DIFFERENTIAL/PLATELET
Basophils Absolute: 0 10*3/uL (ref 0.0–0.1)
Basophils Relative: 0 %
Eosinophils Absolute: 0.4 10*3/uL (ref 0.0–0.7)
Eosinophils Relative: 5 %
HEMATOCRIT: 40 % (ref 39.0–52.0)
HEMOGLOBIN: 12.9 g/dL — AB (ref 13.0–17.0)
LYMPHS ABS: 3 10*3/uL (ref 0.7–4.0)
LYMPHS PCT: 42 %
MCH: 25.2 pg — ABNORMAL LOW (ref 26.0–34.0)
MCHC: 32.3 g/dL (ref 30.0–36.0)
MCV: 78.3 fL (ref 78.0–100.0)
MONOS PCT: 6 %
Monocytes Absolute: 0.4 10*3/uL (ref 0.1–1.0)
NEUTROS ABS: 3.5 10*3/uL (ref 1.7–7.7)
NEUTROS PCT: 47 %
Platelets: 170 10*3/uL (ref 150–400)
RBC: 5.11 MIL/uL (ref 4.22–5.81)
RDW: 14.1 % (ref 11.5–15.5)
WBC: 7.3 10*3/uL (ref 4.0–10.5)

## 2017-09-18 MED ORDER — ALBUTEROL SULFATE (2.5 MG/3ML) 0.083% IN NEBU
5.0000 mg | INHALATION_SOLUTION | Freq: Once | RESPIRATORY_TRACT | Status: AC
  Administered 2017-09-18: 5 mg via RESPIRATORY_TRACT
  Filled 2017-09-18: qty 6

## 2017-09-18 MED ORDER — DM-GUAIFENESIN ER 30-600 MG PO TB12
1.0000 | ORAL_TABLET | Freq: Two times a day (BID) | ORAL | Status: DC
  Administered 2017-09-18: 1 via ORAL
  Filled 2017-09-18: qty 1

## 2017-09-18 MED ORDER — IPRATROPIUM BROMIDE 0.02 % IN SOLN
0.5000 mg | Freq: Once | RESPIRATORY_TRACT | Status: AC
  Administered 2017-09-18: 0.5 mg via RESPIRATORY_TRACT
  Filled 2017-09-18: qty 2.5

## 2017-09-18 NOTE — ED Triage Notes (Signed)
Pt c/o cold symptoms with persistent cough for the past few days and right side flank pain with some discomfort with urination, no fever or chills.

## 2017-09-19 MED ORDER — PREDNISONE 20 MG PO TABS
60.0000 mg | ORAL_TABLET | Freq: Once | ORAL | Status: AC
  Administered 2017-09-19: 60 mg via ORAL
  Filled 2017-09-19: qty 3

## 2017-09-19 MED ORDER — AMOXICILLIN 500 MG PO CAPS
500.0000 mg | ORAL_CAPSULE | Freq: Once | ORAL | Status: AC
  Administered 2017-09-19: 500 mg via ORAL
  Filled 2017-09-19: qty 1

## 2017-09-19 MED ORDER — AZITHROMYCIN 250 MG PO TABS: ORAL_TABLET | ORAL | 0 refills | Status: DC

## 2017-09-19 MED ORDER — AEROCHAMBER PLUS FLO-VU LARGE MISC
1.0000 | Freq: Once | Status: AC
  Administered 2017-09-19: 1

## 2017-09-19 MED ORDER — ALBUTEROL SULFATE HFA 108 (90 BASE) MCG/ACT IN AERS
2.0000 | INHALATION_SPRAY | Freq: Four times a day (QID) | RESPIRATORY_TRACT | Status: DC
  Administered 2017-09-19: 2 via RESPIRATORY_TRACT
  Filled 2017-09-19: qty 6.7

## 2017-09-19 MED ORDER — AMOXICILLIN 500 MG PO CAPS: 500.0000 mg | ORAL_CAPSULE | Freq: Three times a day (TID) | ORAL | 0 refills | Status: DC

## 2017-09-19 MED ORDER — PREDNISONE 20 MG PO TABS: ORAL_TABLET | ORAL | 0 refills | Status: DC

## 2017-09-19 NOTE — ED Provider Notes (Signed)
[facility] EMERGENCY DEPARTMENT Provider Note   CSN: [id number] Arrival date & time: 09/18/17  2021  Time seen 23:35 PM    History   Chief Complaint Chief Complaint  Patient presents with  . URI  . Flank Pain    right side    HPI John Trevino is a 41 y.o. male.  HPI patient reports about a week ago he started having fever, chills, cough with dark yellow sputum that now is clear.  He states he is coughing up lots of phlegm.  He feels short of breath at times and sometimes he has pain in his upper central chest that he describes as aching and is worse when he breathes.  He had a sore throat but it is gone now.  He has been having rhinorrhea that was initially yellow now it is clear.  He had some mild right ear pain but states he wears ear pieces a lot.  He has not had the flu shot, he has been around some people with URIs.  He states his whole family has asthma except him although he has had allergies in the past.  He denies having a history of eczema.  Patient is a smoker.  PCP none  History reviewed. No pertinent past medical history.  There are no active problems to display for this patient.   History reviewed. No pertinent surgical history.     Home Medications    Prior to Admission medications   Medication Sig Start Date End Date Taking? Authorizing Provider  amoxicillin (AMOXIL) 500 MG capsule Take 1 capsule (500 mg total) by mouth 3 (three) times daily. 09/19/17   Devoria Albe, MD  azithromycin (ZITHROMAX Z-PAK) 250 MG tablet Take 2 po the first day then once a day for the next 4 days. 09/19/17   Devoria Albe, MD  dextromethorphan-guaiFENesin [facility] DM) 30-600 MG per 12 hr tablet Take 1 tablet by mouth 2 (two) times daily as needed for cough.    [provider]  HYDROcodone-acetaminophen (HYCET) 7.5-325 mg/15 ml solution Take 10 mLs by mouth 4 (four) times daily as needed for moderate pain. Patient not taking: Reported on 09/17/2016 02/20/14    Fayrene Helper, PA-C  predniSONE (DELTASONE) 20 MG tablet Take 3 po QD x 3d , then 2 po QD x 3d then 1 po QD x 3d 09/19/17   Devoria Albe, MD  Pseudoephedrine-Ibuprofen (ADVIL COLD/SINUS) 30-200 MG TABS Take 2 capsules by mouth daily as needed (for cold).    [provider]    Family History History reviewed. No pertinent family history.  Social History Social History  Substance Use Topics  . Smoking status: Current Every Day Smoker    Packs/day: 1.00    Types: Cigarettes  . Smokeless tobacco: Never Used  . 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   Review of Systems Review of Systems  All other systems reviewed and are negative.    Physical Exam Updated Vital Signs BP 133/81 (BP Location: Left Arm)   Pulse 76   Temp 98.7 F (37.1 C) (Oral)   Resp 16   Ht 5\' 10"  (1.778 m)   Wt 82.9 kg (182 lb 11.2 oz)   SpO2 100%   BMI 26.21 kg/m   Vital signs normal    Physical Exam  Constitutional: He is oriented to person, place, and time. He appears well-developed and well-nourished.  Non-toxic appearance. He does not appear ill. No distress.  HENT:  Head: Normocephalic and atraumatic.  Right Ear: External ear normal.  Left Ear: External ear normal.  Nose: Nose normal. No mucosal edema or rhinorrhea.  Mouth/Throat: Oropharynx is clear and moist and mucous membranes are normal. No dental abscesses or uvula swelling.  Eyes: Pupils are equal, round, and reactive to light. Conjunctivae and EOM are normal.  Neck: Normal range of motion and full passive range of motion without pain. Neck supple.  Cardiovascular: Normal rate, regular rhythm and normal heart sounds.  Exam reveals no gallop and no friction rub.   No murmur heard. Pulmonary/Chest: Effort normal. No respiratory distress. He has decreased breath sounds. He has no wheezes. He has no rhonchi. He has no rales. He exhibits no tenderness  and no crepitus.  Patient is coughing frequently with rattling in his chest.  Abdominal: Soft. Normal appearance and bowel sounds are normal. He exhibits no distension. There is no tenderness. There is no rebound and no guarding.  Musculoskeletal: Normal range of motion. He exhibits no edema or tenderness.  Moves all extremities well.   Neurological: He is alert and oriented to person, place, and time. He has normal strength. No cranial nerve deficit.  Skin: Skin is warm, dry and intact. No rash noted. No erythema. No pallor.  Psychiatric: He has a normal mood and affect. His speech is normal and behavior is normal. His mood appears not anxious.  Nursing note and vitals reviewed.    ED Treatments / Results  Labs (all labs ordered are listed, but only abnormal results are displayed) Results for orders placed or performed during the hospital encounter of 09/18/17  Urinalysis, Routine w reflex microscopic- may I&O cath if menses  Result Value Ref Range   Color, Urine YELLOW YELLOW   APPearance CLEAR CLEAR   Specific Gravity, Urine 1.017 1.005 - 1.030   pH 7.0 5.0 - 8.0   Glucose, UA NEGATIVE NEGATIVE mg/dL   Hgb urine dipstick NEGATIVE NEGATIVE   Bilirubin Urine NEGATIVE NEGATIVE   Ketones, ur NEGATIVE NEGATIVE mg/dL   Protein, ur NEGATIVE NEGATIVE mg/dL   Nitrite NEGATIVE NEGATIVE   Leukocytes, UA NEGATIVE NEGATIVE  CBC with Differential  Result Value Ref Range   WBC 7.3 4.0 - 10.5 K/uL   RBC 5.11 4.22 - 5.81 MIL/uL   Hemoglobin 12.9 (L) 13.0 - 17.0 g/dL   HCT 19.140.0 47.839.0 - 29.552.0 %   MCV 78.3 78.0 - 100.0 fL   MCH 25.2 (L) 26.0 - 34.0 pg   MCHC 32.3 30.0 - 36.0 g/dL   RDW 62.114.1 30.811.5 - 65.715.5 %   Platelets 170 150 - 400 K/uL   Neutrophils Relative % 47 %   Neutro Abs 3.5 1.7 - 7.7 K/uL   Lymphocytes Relative 42 %   Lymphs Abs 3.0 0.7 - 4.0 K/uL   Monocytes Relative 6 %   Monocytes Absolute 0.4 0.1 - 1.0 K/uL   Eosinophils Relative 5 %   Eosinophils Absolute 0.4 0.0 - 0.7 K/uL    Basophils Relative 0 %   Basophils Absolute 0.0 0.0 - 0.1 K/uL  Basic metabolic panel  Result Value Ref Range   Sodium 137 135 - 145 mmol/L   Potassium 3.6 3.5 - 5.1 mmol/L   Chloride 104 101 - 111 mmol/L   CO2 25 22 - 32 mmol/L   Glucose, Bld 95 65 - 99 mg/dL   BUN 14 6 - 20 mg/dL   Creatinine, Ser 8.461.23  0.61 - 1.24 mg/dL   Calcium 8.9 8.9 - 16.1 mg/dL   GFR calc non Af Amer >60 >60 mL/min   GFR calc Af Amer >60 >60 mL/min   Anion gap 8 5 - 15   Laboratory interpretation all normal except new mild anemia since 2012    EKG  EKG Interpretation None       Radiology Dg Chest 2 View  Result Date: 09/18/2017 CLINICAL DATA:  41 year old male with cough and shortness of breath. EXAM: CHEST  2 VIEW COMPARISON:  Chest radiograph dated 02/20/2014 FINDINGS: The heart size and mediastinal contours are within normal limits. Both lungs are clear. The visualized skeletal structures are unremarkable. IMPRESSION: No active cardiopulmonary disease. Electronically Signed   By: Elgie Collard M.D.   On: 09/18/2017 21:20    Procedures Procedures (including critical care time)  Medications Ordered in ED Medications  dextromethorphan-guaiFENesin (MUCINEX DM) 30-600 MG per 12 hr tablet 1 tablet (1 tablet Oral Given 09/18/17 2354)  albuterol (PROVENTIL HFA;VENTOLIN HFA) 108 (90 Base) MCG/ACT inhaler 2 puff (not administered)  AEROCHAMBER PLUS FLO-VU LARGE MISC 1 each (not administered)  predniSONE (DELTASONE) tablet 60 mg (not administered)  amoxicillin (AMOXIL) capsule 500 mg (not administered)  ipratropium (ATROVENT) nebulizer solution 0.5 mg (0.5 mg Nebulization Given 09/18/17 2354)  albuterol (PROVENTIL) (2.5 MG/3ML) 0.083% nebulizer solution 5 mg (5 mg Nebulization Given 09/18/17 2354)     Initial Impression / Assessment and Plan / ED Course  I have reviewed the triage vital signs and the nursing notes.  Pertinent labs & imaging results that were available during my care of the  patient were reviewed by me and considered in my medical decision making (see chart for details).    Patient is coughing constantly.  He was given an albuterol/Atrovent nebulizer to see if that would help with his symptoms.  He was also given Mucinex DM.  12:50 PM  Recheck, patient is not coughing, states he feels better. Will show him how to use an inhaler.  He has had symptoms for a week and he is a smoker so he was started on steroids and antibiotics.  Final Clinical Impressions(s) / ED Diagnoses   Final diagnoses:  Bronchitis with bronchospasm  Right flank pain    New Prescriptions New Prescriptions   AMOXICILLIN (AMOXIL) 500 MG CAPSULE    Take 1 capsule (500 mg total) by mouth 3 (three) times daily.   AZITHROMYCIN (ZITHROMAX Z-PAK) 250 MG TABLET    Take 2 po the first day then once a day for the next 4 days.   PREDNISONE (DELTASONE) 20 MG TABLET    Take 3 po QD x 3d , then 2 po QD x 3d then 1 po QD x 3d  OTC mucinex DM  Plan discharge  Devoria Albe, MD, Concha Pyo, MD 09/19/17 0100

## 2017-09-19 NOTE — Discharge Instructions (Signed)
Take the medications as prescribed. Take mucinex DM OTC for coughing. Drink plenty of fluids. Use ice and heat for comfort on your back.

## 2017-09-19 NOTE — ED Notes (Signed)
Pt understood dc material. NAD noted. Work note given at dc 

## 2018-06-05 ENCOUNTER — Ambulatory Visit (HOSPITAL_COMMUNITY)
Admission: EM | Admit: 2018-06-05 | Discharge: 2018-06-05 | Disposition: A | Discharge status: Home / Self Care | Payer: BLUE CROSS/BLUE SHIELD | Attending: Family Medicine | Admitting: Family Medicine

## 2018-06-05 ENCOUNTER — Encounter (HOSPITAL_COMMUNITY): Payer: Self-pay | Admitting: Emergency Medicine

## 2018-06-05 ENCOUNTER — Other Ambulatory Visit: Payer: Self-pay

## 2018-06-05 ENCOUNTER — Ambulatory Visit (INDEPENDENT_AMBULATORY_CARE_PROVIDER_SITE_OTHER): Payer: BLUE CROSS/BLUE SHIELD

## 2018-06-05 DIAGNOSIS — S60212A Contusion of left wrist, initial encounter: Secondary | ICD-10-CM

## 2018-06-05 DIAGNOSIS — M25532 Pain in left wrist: Secondary | ICD-10-CM | POA: Diagnosis not present

## 2018-06-05 MED ORDER — NAPROXEN 500 MG PO TABS: 500.0000 mg | ORAL_TABLET | Freq: Two times a day (BID) | ORAL | 0 refills | Status: DC

## 2018-06-05 MED ORDER — IBUPROFEN 800 MG PO TABS
ORAL_TABLET | ORAL | Status: AC
  Filled 2018-06-05: qty 1

## 2018-06-05 MED ORDER — IBUPROFEN 800 MG PO TABS
800.0000 mg | ORAL_TABLET | Freq: Once | ORAL | Status: AC
  Administered 2018-06-05: 800 mg via ORAL

## 2018-06-05 NOTE — ED Triage Notes (Signed)
Pt reports left wrist pain x2-3 days.  He knows of no known injury to the wrist.

## 2018-06-05 NOTE — ED Provider Notes (Signed)
[facility]    CSN: [id number] Arrival date & time: 06/05/18  1320     History   Chief Complaint Chief Complaint  Patient presents with  . Wrist Pain    left    HPI John Trevino is a 42 y.o. male.   John Trevino presents with complaints of left wrist pain. States 2-3 days ago he struck it on a table on ulnar side, where pain exists. States he does use his hands a lot as he works for [employer], but no specific injury to the wrist at work. He is right handed. Pain is worse with rotation. No elbow pain. No numbness or tingling. No weakness. Pain 10/10 with movement. Has not taken any medications for pain. Has been wearing a brace he got OTC which has helped some. No previous injury he is aware of. Without contributing medical history.     ROS per HPI.      History reviewed. No pertinent past medical history.  There are no active problems to display for this patient.   History reviewed. No pertinent surgical history.     Home Medications    Prior to Admission medications   Medication Sig Start Date End Date Taking? Authorizing Provider  naproxen (NAPROSYN) 500 MG tablet Take 1 tablet (500 mg total) by mouth 2 (two) times daily. 06/05/18   Georgetta Haber, NP    Family History History reviewed. No pertinent family history.  Social History Social History   Tobacco Use  . Smoking status: Current Every Day Smoker    Packs/day: 0.50    Types: Cigarettes  . Smokeless tobacco: Never Used  Substance Use Topics  . Alcohol use: No  . Drug use: Yes    Types: Marijuana     Allergies   Patient has no known allergies.   Review of Systems Review of Systems   Physical Exam Triage Vital Signs ED Triage Vitals [06/05/18 1337]  Enc Vitals Group     BP 138/60     Pulse Rate (!) 56     Resp      Temp 98.6 F (37 C)     Temp Source Oral     SpO2 99 %     Weight      Height      Head Circumference      Peak Flow      Pain Score 10     Pain Loc      Pain  Edu?      Excl. in GC?    No data found.  Updated Vital Signs BP 138/60 (BP Location: Right Arm)   Pulse (!) 56   Temp 98.6 F (37 C) (Oral)   SpO2 99%    Physical Exam  Constitutional: He is oriented to person, place, and time. He appears well-developed and well-nourished.  Cardiovascular: Normal rate and regular rhythm.  Pulmonary/Chest: Effort normal and breath sounds normal.  Musculoskeletal:       Left elbow: Normal.       Left wrist: He exhibits tenderness and bony tenderness. He exhibits normal range of motion, no swelling, no effusion, no crepitus, no deformity and no laceration.       Left hand: Normal.  Tenderness at distal ulna, specific point tenderness; pain with supination/pronation of forearm; minimal pain with flexion and extension; strong pulses; cap refill < 2 seconds ; sensation intact.   Neurological: He is alert and oriented to person, place, and time.  Skin: Skin is warm  and dry.     UC Treatments / Results  Labs (all labs ordered are listed, but only abnormal results are displayed) Labs Reviewed - No data to display  EKG None  Radiology Dg Wrist Complete Left  Result Date: 06/05/2018 CLINICAL DATA:  Per pt: at work hit the left wrist on a wooden table, three days ago. Hurt the same wrist a month ago, did not get an x-ray. Pain is the left wrist, distal ulna. Patient is not a diabetic. EXAM: LEFT WRIST - COMPLETE 3+ VIEW COMPARISON:  None. FINDINGS: There is no evidence of fracture or dislocation. There is no evidence of arthropathy or other focal bone abnormality. Soft tissues are unremarkable. IMPRESSION: Negative. Electronically Signed   By: Amie Portlandavid  Ormond M.D.   On: 06/05/2018 14:28    Procedures Procedures (including critical care time)  Medications Ordered in UC Medications  ibuprofen (ADVIL,MOTRIN) tablet 800 mg (800 mg Oral Given 06/05/18 1435)    Initial Impression / Assessment and Plan / UC Course  I have reviewed the triage vital signs  and the nursing notes.  Pertinent labs & imaging results that were available during my care of the patient were reviewed by me and considered in my medical decision making (see chart for details).     Xray normal today. Consistent with contusion from striking the table. Ice, elevation, naproxen. May use the brace as needed. If symptoms worsen or do not improve in the next 2-3 weeks to return to be seen or to follow up with PCP.   Patient verbalized understanding and agreeable to plan.    Final Clinical Impressions(s) / UC Diagnoses   Final diagnoses:  Left wrist pain  Contusion of left wrist, initial encounter     Discharge Instructions     You may continue to wear the brace for comfort.  Activity as tolerated. Naproxen twice a day, take with food. First dose tonight.  If symptoms worsen or do not improve in the next 2-3 weeks to return to be seen or to follow up with your PCP.      ED Prescriptions    Medication Sig Dispense Auth. Provider   naproxen (NAPROSYN) 500 MG tablet Take 1 tablet (500 mg total) by mouth 2 (two) times daily. 30 tablet Georgetta HaberBurky, Natalie B, NP     Controlled Substance Prescriptions [place] Controlled Substance Registry consulted? Not Applicable   Georgetta HaberBurky, Natalie B, NP 06/05/18 1443

## 2018-06-05 NOTE — Discharge Instructions (Signed)
You may continue to wear the brace for comfort.  Activity as tolerated. Naproxen twice a day, take with food. First dose tonight.  If symptoms worsen or do not improve in the next 2-3 weeks to return to be seen or to follow up with your PCP.

## 2018-10-02 ENCOUNTER — Other Ambulatory Visit: Payer: Self-pay

## 2018-10-02 ENCOUNTER — Emergency Department (HOSPITAL_COMMUNITY)
Admission: EM | Admit: 2018-10-02 | Discharge: 2018-10-02 | Disposition: A | Discharge status: Home / Self Care | Payer: BLUE CROSS/BLUE SHIELD | Attending: Emergency Medicine | Admitting: Emergency Medicine

## 2018-10-02 ENCOUNTER — Emergency Department (HOSPITAL_COMMUNITY): Payer: BLUE CROSS/BLUE SHIELD

## 2018-10-02 ENCOUNTER — Encounter (HOSPITAL_COMMUNITY): Payer: Self-pay | Admitting: Emergency Medicine

## 2018-10-02 DIAGNOSIS — M25512 Pain in left shoulder: Secondary | ICD-10-CM | POA: Insufficient documentation

## 2018-10-02 DIAGNOSIS — F17210 Nicotine dependence, cigarettes, uncomplicated: Secondary | ICD-10-CM | POA: Diagnosis not present

## 2018-10-02 DIAGNOSIS — M62838 Other muscle spasm: Secondary | ICD-10-CM | POA: Diagnosis not present

## 2018-10-02 DIAGNOSIS — R0789 Other chest pain: Secondary | ICD-10-CM | POA: Diagnosis not present

## 2018-10-02 HISTORY — DX: Bronchitis, not specified as acute or chronic: J40

## 2018-10-02 MED ORDER — LIDOCAINE 5 % EX PTCH
1.0000 | MEDICATED_PATCH | CUTANEOUS | Status: DC
  Administered 2018-10-02: 1 via TRANSDERMAL
  Filled 2018-10-02: qty 1

## 2018-10-02 MED ORDER — NAPROXEN 500 MG PO TABS: 500.0000 mg | ORAL_TABLET | Freq: Two times a day (BID) | ORAL | 0 refills | Status: DC

## 2018-10-02 MED ORDER — CYCLOBENZAPRINE HCL 10 MG PO TABS: 10.0000 mg | ORAL_TABLET | Freq: Two times a day (BID) | ORAL | 0 refills | Status: DC | PRN

## 2018-10-02 MED ORDER — NAPROXEN 250 MG PO TABS
500.0000 mg | ORAL_TABLET | Freq: Once | ORAL | Status: AC
  Administered 2018-10-02: 500 mg via ORAL
  Filled 2018-10-02: qty 2

## 2018-10-02 NOTE — Discharge Instructions (Addendum)
Apply warm compresses to sore muscles for 20 minutes at a time. Take naproxen and Flexeril as needed as prescribed for pain.  Do not take Flexeril driving or operate machinery Follow-up with your primary care provider for further treatment or referral to physical therapy if needed.

## 2018-10-02 NOTE — ED Provider Notes (Signed)
[facility] EMERGENCY DEPARTMENT Provider Note   CSN: [id number] Arrival date & time: 10/02/18  1142     History   Chief Complaint Chief Complaint  Patient presents with  . Chest Pain    HPI John Trevino is a 43 y.o. male.  42 year old male presents with complaint of pain in his left shoulder and upper chest area for the past month.  Patient reports daily pain, worse with movement and heavy lifting.  Patient works for [employer], does a lot of heavy lifting but does not recall specific injury.  Denies shortness of breath, diaphoresis, nausea, vomiting.  No history of high blood pressure high cholesterol.  Patient does a daily smoker, no significant family history.  Patient has not tried taking anything for his pain.  No other complaints or concerns.     Past Medical History:  Diagnosis Date  . Bronchitis     There are no active problems to display for this patient.   Past Surgical History:  Procedure Laterality Date  . THROAT SURGERY          Home Medications    Prior to Admission medications   Medication Sig Start Date End Date Taking? Authorizing Provider  cyclobenzaprine (FLEXERIL) 10 MG tablet Take 1 tablet (10 mg total) by mouth 2 (two) times daily as needed for muscle spasms. 10/02/18   Jeannie Fend, PA-C  naproxen (NAPROSYN) 500 MG tablet Take 1 tablet (500 mg total) by mouth 2 (two) times daily. 10/02/18   Jeannie Fend, PA-C    Family History No family history on file.  Social History Social History   Tobacco Use  . Smoking status: Current Every Day Smoker    Packs/day: 0.50    Types: Cigarettes  . Smokeless tobacco: Never Used  Substance Use Topics  . Alcohol use: No  . Drug use: Yes    Types: Marijuana     Allergies   Patient has no known allergies.   Review of Systems Review of Systems  Constitutional: Negative for fever.  Respiratory: Negative for chest tightness, shortness of breath and wheezing.     Cardiovascular: Positive for chest pain.  Gastrointestinal: Negative for abdominal pain, nausea and vomiting.  Musculoskeletal: Positive for arthralgias and myalgias. Negative for neck pain and neck stiffness.  Skin: Negative for rash and wound.  Allergic/Immunologic: Negative for immunocompromised state.  Neurological: Negative for weakness, numbness and headaches.  Hematological: Negative for adenopathy. Does not bruise/bleed easily.  Psychiatric/Behavioral: Negative for confusion.  All other systems reviewed and are negative.    Physical Exam Updated Vital Signs BP 138/73   Pulse 72   Temp 98.9 F (37.2 C) (Oral)   Resp 20   Ht 5\' 10"  (1.778 m)   Wt 77.1 kg   SpO2 100%   BMI 24.39 kg/m   Physical Exam  Constitutional: He is oriented to person, place, and time. He appears well-developed and well-nourished. No distress.  HENT:  Head: Normocephalic and atraumatic.  Neck: Neck supple.  Cardiovascular: Normal rate, regular rhythm, intact distal pulses and normal pulses.  Pulmonary/Chest: Effort normal and breath sounds normal.  Musculoskeletal:       Left shoulder: He exhibits decreased range of motion and tenderness. He exhibits no bony tenderness, no swelling, no effusion, no crepitus, no deformity, normal pulse and normal strength.       Cervical back: He exhibits tenderness. He exhibits normal range of motion and no bony tenderness.  Back:       Arms:      Right lower leg: Normal.       Left lower leg: Normal.  TTP left trapezius muscle with palpable spasm, TTP left pectoral muscle, pain reproduced with ROM left shoulder and palpation.   Lymphadenopathy:    He has no cervical adenopathy.  Neurological: He is alert and oriented to person, place, and time.  Skin: Skin is warm and dry. He is not diaphoretic.  Psychiatric: He has a normal mood and affect. His behavior is normal.  Nursing note and vitals reviewed.    ED Treatments / Results  Labs (all labs  ordered are listed, but only abnormal results are displayed) Labs Reviewed - No data to display  EKG EKG Interpretation  Date/Time:  Monday October 02 2018 11:49:38 EST Ventricular Rate:  76 PR Interval:    QRS Duration: 86 QT Interval:  382 QTC Calculation: 430 R Axis:   80 Text Interpretation:  Sinus rhythm Left ventricular hypertrophy No previous ECGs available Confirmed by Tilden Fossa [phone]) on 10/02/2018 12:08:47 PM   Radiology Dg Shoulder Left  Result Date: 10/02/2018 CLINICAL DATA:  Left shoulder pain EXAM: LEFT SHOULDER - 2+ VIEW COMPARISON:  None. FINDINGS: There is no evidence of fracture or dislocation. There is no evidence of arthropathy or other focal bone abnormality. Soft tissues are unremarkable. IMPRESSION: No acute osseous injury of the left shoulder. Electronically Signed   By: Elige Ko   On: 10/02/2018 13:07    Procedures Procedures (including critical care time)  Medications Ordered in ED Medications  lidocaine (LIDODERM) 5 % 1 patch (1 patch Transdermal Patch Applied 10/02/18 1212)  naproxen (NAPROSYN) tablet 500 mg (500 mg Oral Given 10/02/18 1212)     Initial Impression / Assessment and Plan / ED Course  I have reviewed the triage vital signs and the nursing notes.  Pertinent labs & imaging results that were available during my care of the patient were reviewed by me and considered in my medical decision making (see chart for details).  Clinical Course as of Oct 02 1317  Mon Oct 02, 2018  595 42 year old male with complaint of pain in his left trapezius area as well as left pectoral area.  Pain and range of motion reproduce his pain.  X-ray of left shoulder is unremarkable.  Recommend patient follow-up with his primary care provider for further management and possible referral to physical therapy.  Patient was given prescription for Flexeril and naproxen.   [LM]    Clinical Course User Index [LM] Jeannie Fend, PA-C   Final Clinical  Impressions(s) / ED Diagnoses   Final diagnoses:  Chest wall pain  Acute pain of left shoulder  Muscle spasm    ED Discharge Orders         Ordered    naproxen (NAPROSYN) 500 MG tablet  2 times daily     10/02/18 1316    cyclobenzaprine (FLEXERIL) 10 MG tablet  2 times daily PRN     10/02/18 1316           Alden Hipp 10/02/18 1318    Tilden Fossa, MD 10/03/18 [phone]

## 2018-10-02 NOTE — ED Triage Notes (Signed)
Patient presents ambulatory c/o upper left shoulder/ chest pain x 1 month. Denies any shortness of breath, dizziness or nausea. Pain worse with movement. Reports he has a very physical job but denies any injury.

## 2018-10-29 ENCOUNTER — Ambulatory Visit (HOSPITAL_COMMUNITY)
Admission: EM | Admit: 2018-10-29 | Discharge: 2018-10-29 | Disposition: A | Discharge status: Home / Self Care | Payer: BLUE CROSS/BLUE SHIELD | Attending: Physician Assistant | Admitting: Physician Assistant

## 2018-10-29 ENCOUNTER — Encounter (HOSPITAL_COMMUNITY): Payer: Self-pay | Admitting: Emergency Medicine

## 2018-10-29 DIAGNOSIS — M62838 Other muscle spasm: Secondary | ICD-10-CM

## 2018-10-29 MED ORDER — LIDOCAINE HCL (PF) 2 % IJ SOLN
INTRAMUSCULAR | Status: AC
  Filled 2018-10-29: qty 2

## 2018-10-29 MED ORDER — NAPROXEN 500 MG PO TABS: 500.0000 mg | ORAL_TABLET | Freq: Two times a day (BID) | ORAL | 0 refills | Status: DC

## 2018-10-29 MED ORDER — CYCLOBENZAPRINE HCL 10 MG PO TABS: 10.0000 mg | ORAL_TABLET | Freq: Three times a day (TID) | ORAL | 0 refills | Status: DC

## 2018-10-29 MED ORDER — TRIAMCINOLONE ACETONIDE 40 MG/ML IJ SUSP
INTRAMUSCULAR | Status: AC
  Filled 2018-10-29: qty 1

## 2018-10-29 NOTE — ED Triage Notes (Signed)
Pt c/o L shoulder pain for months, states he went to th ER and told it was a strain and its getting worse.

## 2018-10-29 NOTE — Discharge Instructions (Signed)
Take meds as prescribed

## 2018-10-29 NOTE — ED Provider Notes (Signed)
10/29/2018 4:53 PM   DOB: 06/01/1976 / MRN: [id number]  SUBJECTIVE:  John Trevino is a 42 y.o. male presenting for neck pain with a  that started a few weeks ago where he was evaluated in the ED and then went back to work and feels he made him symptoms worse.  Today he reports unrelenting left trapezius pain.  Is not taking any medicines.  He has No Known Allergies.   He  has a past medical history of Bronchitis.    He  reports that he has been smoking cigarettes. He has been smoking about 0.50 packs per day. He has never used smokeless tobacco. He reports that he has current or past drug history. Drug: Marijuana. He reports that he does not drink alcohol. He  reports that he currently engages in sexual activity. He reports using the following method of birth control/protection: None. The patient  has a past surgical history that includes Throat surgery.  His family history is not on file.  ROS per HPI OBJECTIVE:  BP (!) 147/94 (BP Location: Right Arm)   Pulse 76   Temp 98.5 F (36.9 C) (Oral)   Resp 16   SpO2 100%   Wt Readings from Last 3 Encounters:  10/02/18 170 lb (77.1 kg)  09/18/17 182 lb 11.2 oz (82.9 kg)  09/05/14 190 lb (86.2 kg)   Temp Readings from Last 3 Encounters:  10/29/18 98.5 F (36.9 C) (Oral)  10/02/18 98.9 F (37.2 C) (Oral)  06/05/18 98.6 F (37 C) (Oral)   BP Readings from Last 3 Encounters:  10/29/18 (!) 147/94  10/02/18 134/79  06/05/18 138/60   Pulse Readings from Last 3 Encounters:  10/29/18 76  10/02/18 77  06/05/18 (!) 56    Physical Exam  Constitutional: He is oriented to person, place, and time. He appears well-developed. He does not appear ill.  Eyes: Pupils are equal, round, and reactive to light. Conjunctivae and EOM are normal.  Cardiovascular: Normal rate.  Pulmonary/Chest: Effort normal.  Abdominal: He exhibits no distension.  Musculoskeletal: Normal range of motion. He exhibits tenderness (Upper trapezius tenderness about the  left trap only.).  Neurological: He is alert and oriented to person, place, and time. No cranial nerve deficit. Coordination normal.  Skin: Skin is warm and dry. He is not diaphoretic.  Psychiatric: He has a normal mood and affect.  Nursing note and vitals reviewed.  Procedure: Antiseptic prep with alcohol pad x3 about the left upper trapezius.  Point of maximal tenderness was palpated and using a 25-gauge 1 inch needle I injected 2% lidocaine mixed with triamcinolone into the muscle belly.  The patient instantly felt relief.  Procedure well-tolerated patient ambulated from the clinic without difficulty.   No results found for this or any previous visit (from the past 72 hour(s)).  No results found.  ASSESSMENT AND PLAN:   Trapezius muscle spasm: Given trigger point injections.  Of advised that he take NSAIDs as prescribed along with muscle relaxers.  Have given him a note for work that keeps him lifting greater than 25 pounds.    Discharge Instructions     Take meds as prescribed.          The patient is advised to call or return to clinic if he does not see an improvement in symptoms, or to seek the care of the closest emergency department if he worsens with the above plan.   Deliah BostonMichael Guthrie Lemme, MHS, PA-C 10/29/2018 4:53 PM   Ofilia Neaslark, Jerni Selmer L,  PA-C 10/29/18 1655

## 2018-12-20 ENCOUNTER — Encounter (HOSPITAL_COMMUNITY): Payer: Self-pay | Admitting: Emergency Medicine

## 2018-12-20 ENCOUNTER — Emergency Department (HOSPITAL_COMMUNITY)
Admission: EM | Admit: 2018-12-20 | Discharge: 2018-12-20 | Disposition: A | Discharge status: Home / Self Care | Payer: BLUE CROSS/BLUE SHIELD | Attending: Emergency Medicine | Admitting: Emergency Medicine

## 2018-12-20 ENCOUNTER — Other Ambulatory Visit: Payer: Self-pay

## 2018-12-20 DIAGNOSIS — Y929 Unspecified place or not applicable: Secondary | ICD-10-CM | POA: Diagnosis not present

## 2018-12-20 DIAGNOSIS — X58XXXA Exposure to other specified factors, initial encounter: Secondary | ICD-10-CM | POA: Insufficient documentation

## 2018-12-20 DIAGNOSIS — S46812A Strain of other muscles, fascia and tendons at shoulder and upper arm level, left arm, initial encounter: Secondary | ICD-10-CM | POA: Diagnosis not present

## 2018-12-20 DIAGNOSIS — Y939 Activity, unspecified: Secondary | ICD-10-CM | POA: Insufficient documentation

## 2018-12-20 DIAGNOSIS — Y999 Unspecified external cause status: Secondary | ICD-10-CM | POA: Insufficient documentation

## 2018-12-20 DIAGNOSIS — S46812D Strain of other muscles, fascia and tendons at shoulder and upper arm level, left arm, subsequent encounter: Secondary | ICD-10-CM

## 2018-12-20 NOTE — Discharge Instructions (Signed)
You can take aleve, available over the counter according to label instructions for pain.  [facility]

## 2018-12-20 NOTE — ED Triage Notes (Signed)
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 Pt reports received injections without relief.

## 2018-12-20 NOTE — ED Provider Notes (Signed)
[facility] EMERGENCY DEPARTMENT Provider Note   CSN: [id number] Arrival date & time: 12/20/18  1023     History   Chief Complaint Chief Complaint  Patient presents with  . Shoulder Pain    HPI John Trevino is a 43 y.o. male.  The history is provided by the patient. No language interpreter was used.  Shoulder Pain   John Trevino is a 42 y.o. male who presents to the Emergency Department complaining of shoulder pain. Presents to the emergency department complaining of several months of left posterior shoulder pain. He states that he injured his shoulder lifting at work a few months ago and has experienced intermittent pain since that time. His pain is been worse for the last few days with associated tingling that radiates down his left upper extremity. Pain is worse with lifting and activity. He denies any associated fevers, chest pain, shortness of breath, weakness. He has tried naproxen at home with partial improvement in his symptoms. He is right-handed. Past Medical History:  Diagnosis Date  . Bronchitis     There are no active problems to display for this patient.   Past Surgical History:  Procedure Laterality Date  . THROAT SURGERY          Home Medications    Prior to Admission medications   Medication Sig Start Date End Date Taking? Authorizing Provider  cyclobenzaprine (FLEXERIL) 10 MG tablet Take 1 tablet (10 mg total) by mouth 3 (three) times daily. 10/29/18   Ofilia Neas, PA-C    Family History No family history on file.  Social History Social History   Tobacco Use  . Smoking status: Current Every Day Smoker    Packs/day: 0.50    Types: Cigarettes  . Smokeless tobacco: Never Used  Substance Use Topics  . Alcohol use: No  . Drug use: Yes    Types: Marijuana     Allergies   Patient has no known allergies.   Review of Systems Review of Systems  All other systems reviewed and are negative.    Physical  Exam Updated Vital Signs BP 137/82 (BP Location: Right Arm)   Pulse 97   Temp 99 F (37.2 C)   SpO2 100%   Physical Exam Vitals signs and nursing note reviewed.  Constitutional:      Appearance: He is well-developed.  HENT:     Head: Normocephalic and atraumatic.  Cardiovascular:     Rate and Rhythm: Normal rate and regular rhythm.     Heart sounds: No murmur.  Pulmonary:     Effort: Pulmonary effort is normal. No respiratory distress.     Breath sounds: Normal breath sounds.  Musculoskeletal:     Comments: 2+ radial pulses bilaterally. There is tenderness to palpation over the left trapezius. Range of motion is intact throughout the left upper extremity. No midline cervical or thoracic tenderness  Skin:    General: Skin is warm and dry.     Capillary Refill: Capillary refill takes less than 2 seconds.  Neurological:     Mental Status: He is alert and oriented to person, place, and time.     Comments: Five out of five strength and bilateral upper extremities with sensation to light touch intact and bilateral upper extremities  Psychiatric:        Behavior: Behavior normal.      ED Treatments / Results  Labs (all labs ordered are listed, but only abnormal results are displayed) Labs Reviewed -  No data to display  EKG None  Radiology No results found.  Procedures Procedures (including critical care time)  Medications Ordered in ED Medications - No data to display   Initial Impression / Assessment and Plan / ED Course  I have reviewed the triage vital signs and the nursing notes.  Pertinent labs & imaging results that were available during my care of the patient were reviewed by me and considered in my medical decision making (see chart for details).     Patient here for evaluation of acute on chronic left shoulder pain. He is neurovascularly intact on examination. Discussed with patient home care for shoulder strain. Discussed importance of PCP/occupational  health follow-up for further evaluation and management.  Final Clinical Impressions(s) / ED Diagnoses   Final diagnoses:  Strain of left trapezius muscle, subsequent encounter    ED Discharge Orders    None       Tilden Fossaees, Kamaree Wheatley, MD 12/20/18 [phone]

## 2019-05-10 IMAGING — DX DG WRIST COMPLETE 3+V*L*
4 series · 4 of 4 positions shown · non-contrast
Comparison: None.

CLINICAL DATA: Per pt: at work hit the left wrist on [place]
table, three days ago. Hurt the same wrist a month ago, did not get
an x-ray. Pain is the left wrist, distal ulna. Patient is not a
diabetic.

EXAM:
LEFT WRIST - COMPLETE 3+ VIEW

[wrist pa]
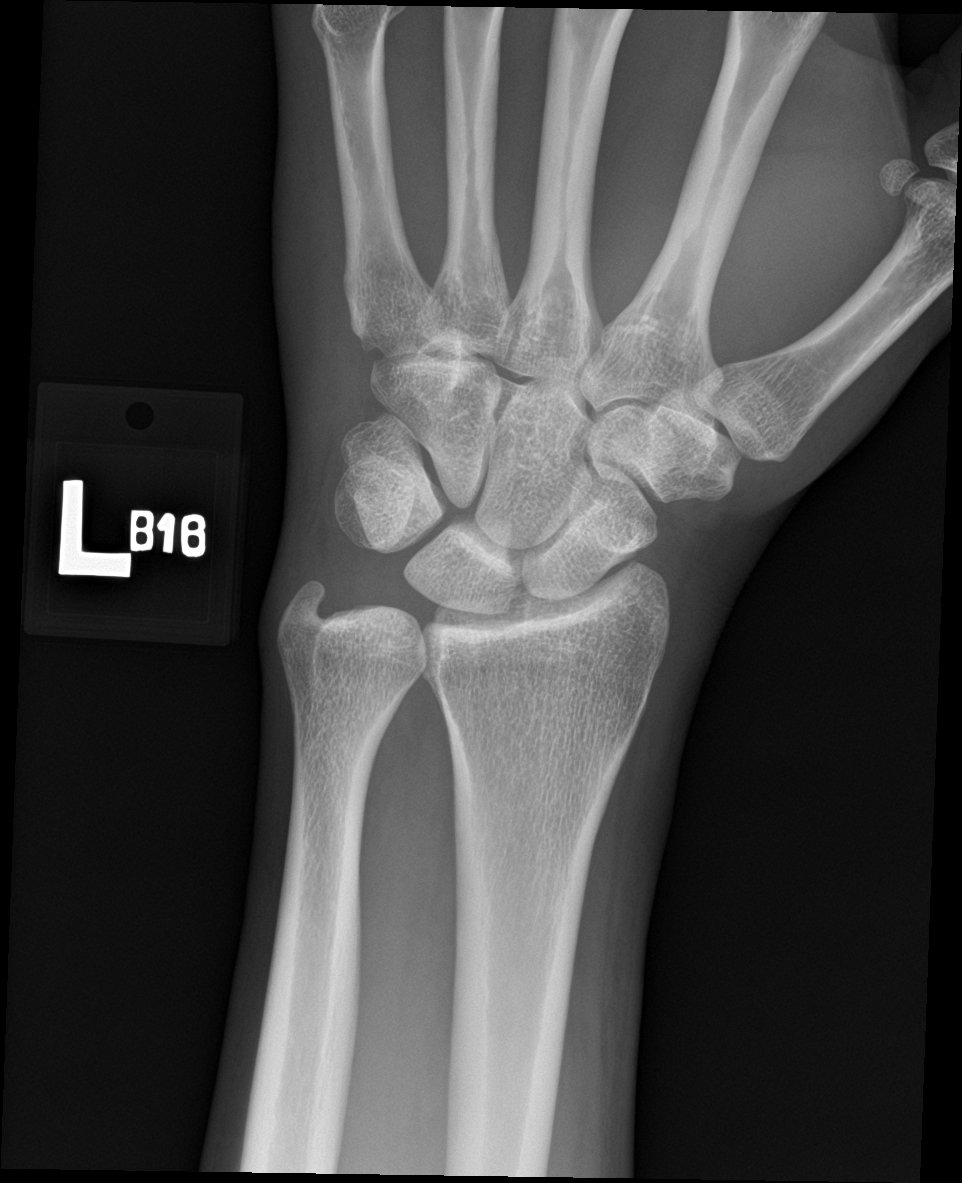

[wrist navicular]
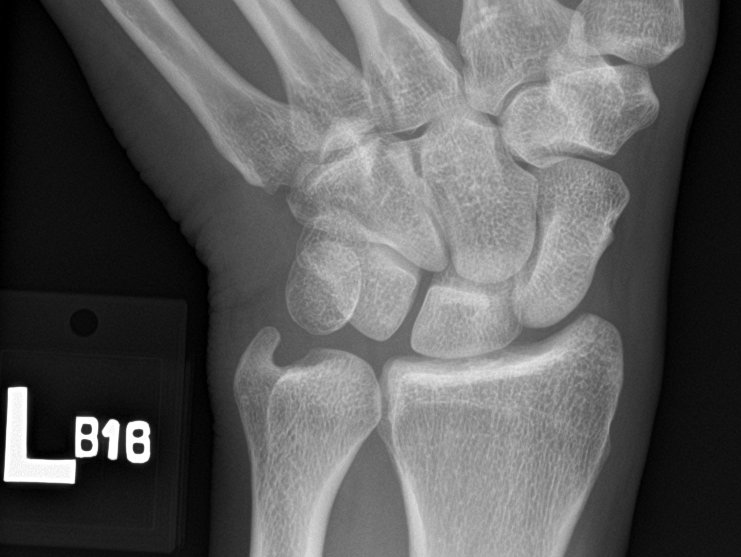

[wrist obl]
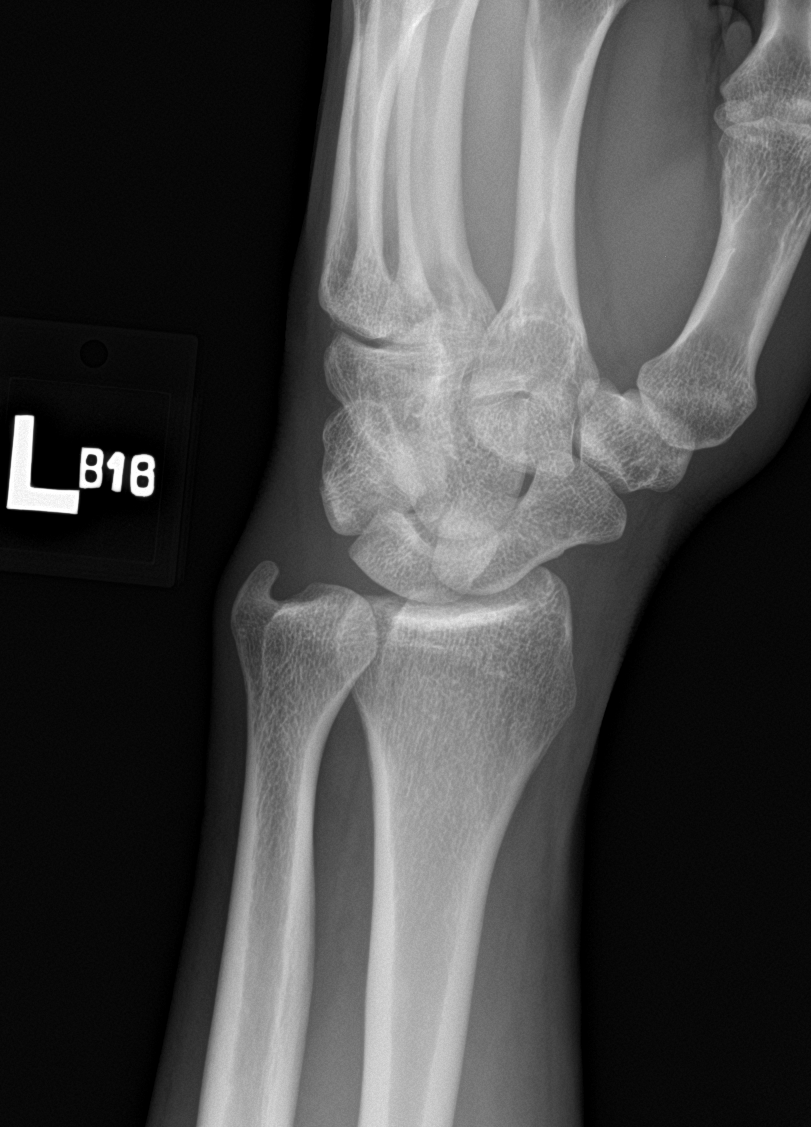

[wrist lat]
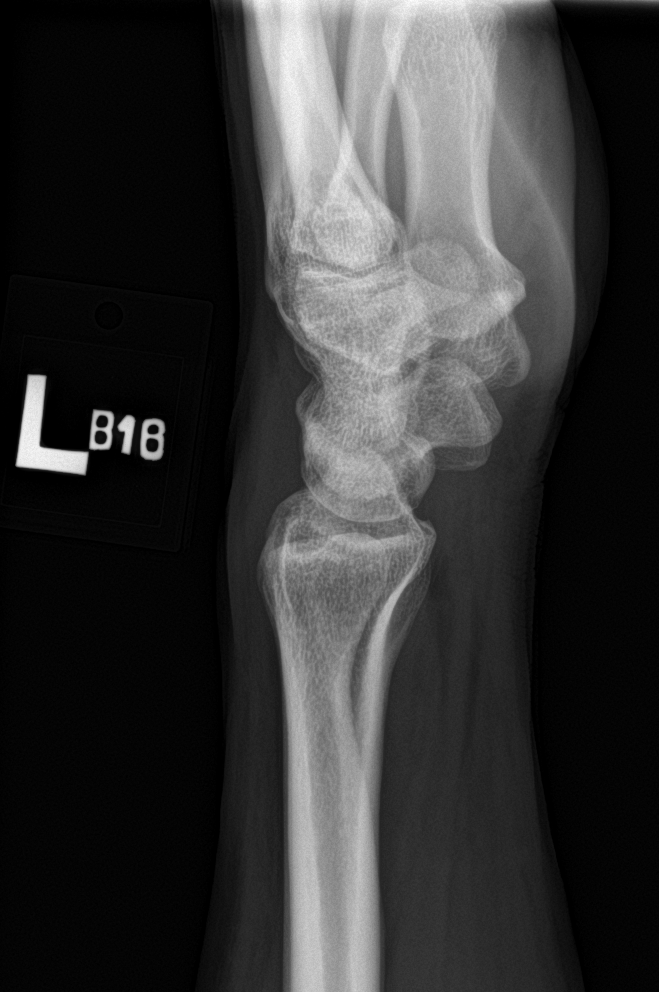

[4 of 4 positions shown; findings below may reference images not displayed]

FINDINGS: There is no evidence of fracture or dislocation. There is no
evidence of arthropathy or other focal bone abnormality. Soft
tissues are unremarkable.
IMPRESSION: Negative.

## 2019-09-06 IMAGING — CR DG SHOULDER 2+V*L*
3 series · 3 of 3 positions shown · non-contrast
Comparison: None.

CLINICAL DATA: Left shoulder pain

EXAM:
LEFT SHOULDER - 2+ VIEW

[shoulder grashey]
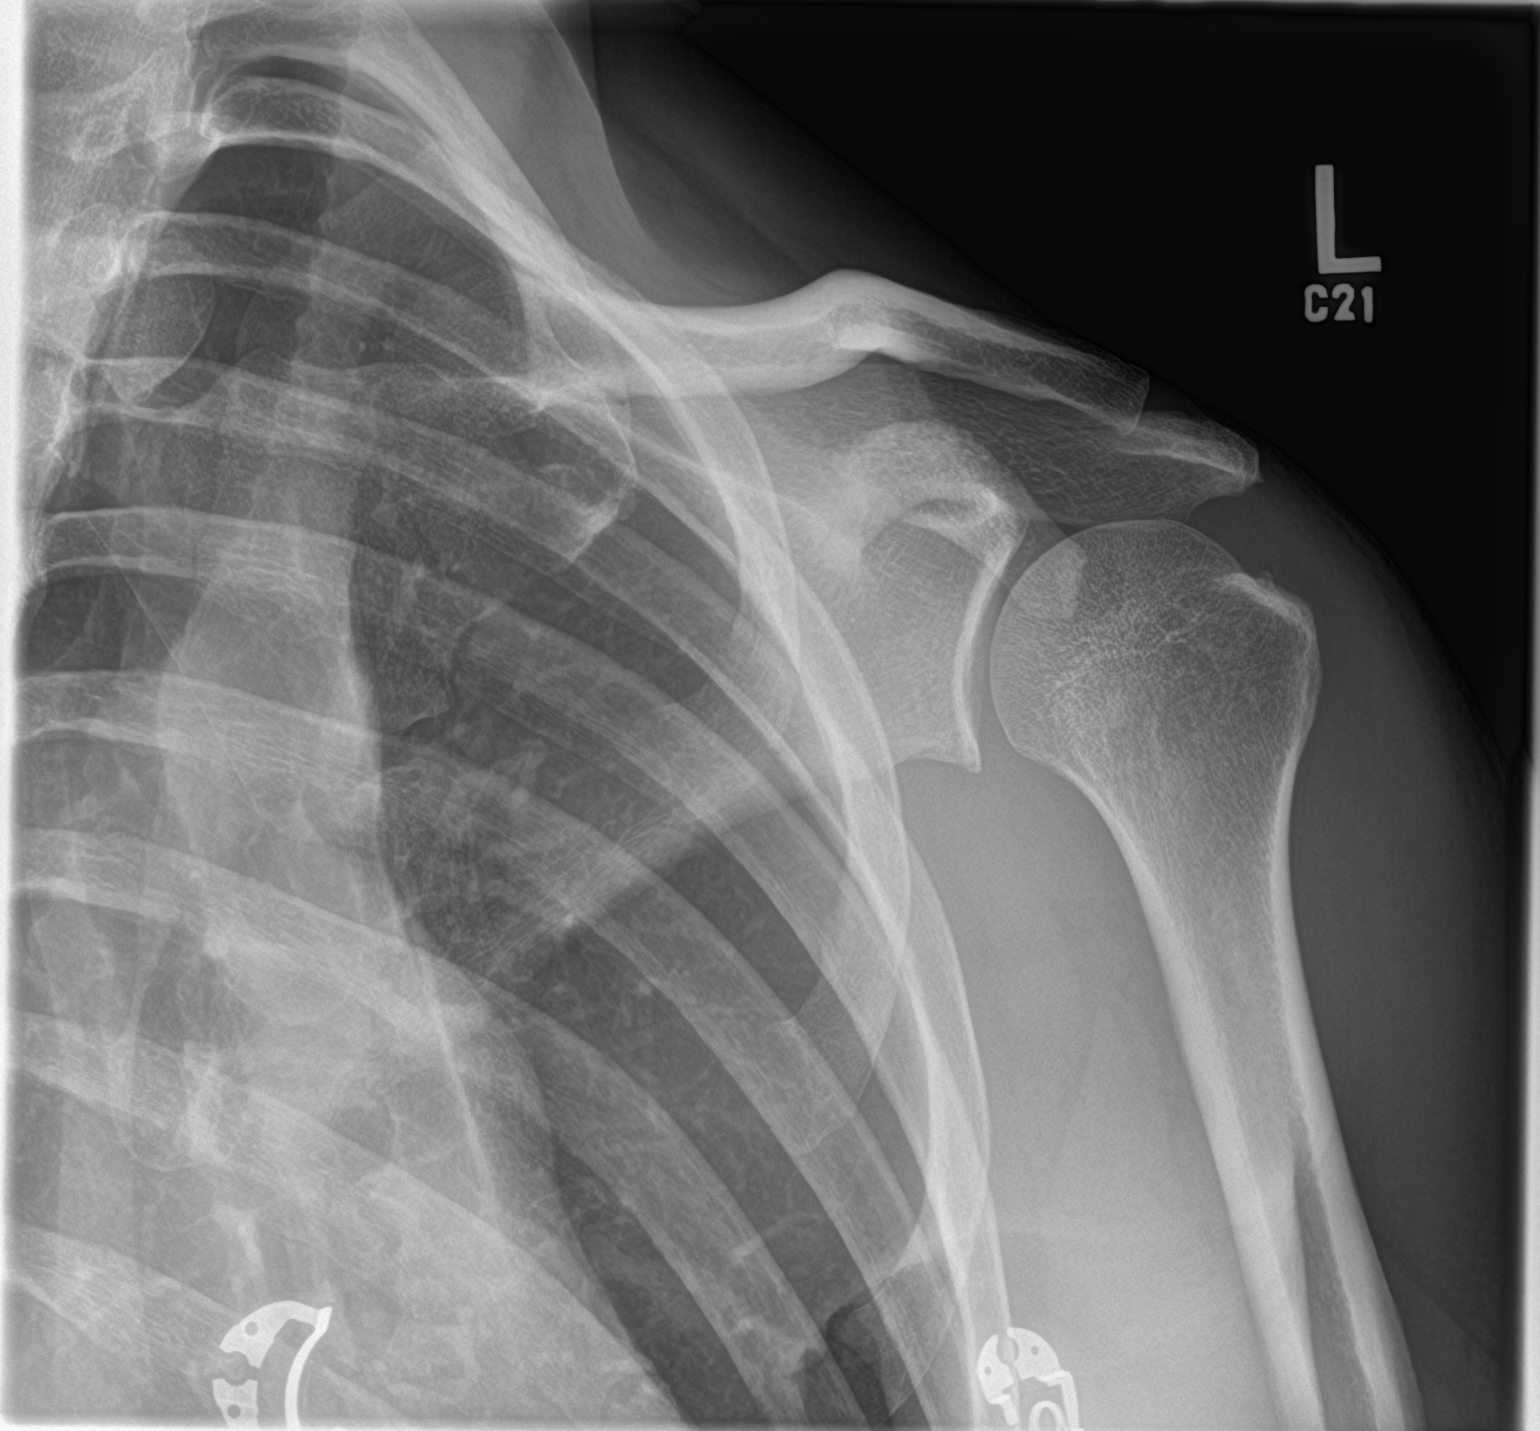

[shoulder y view]
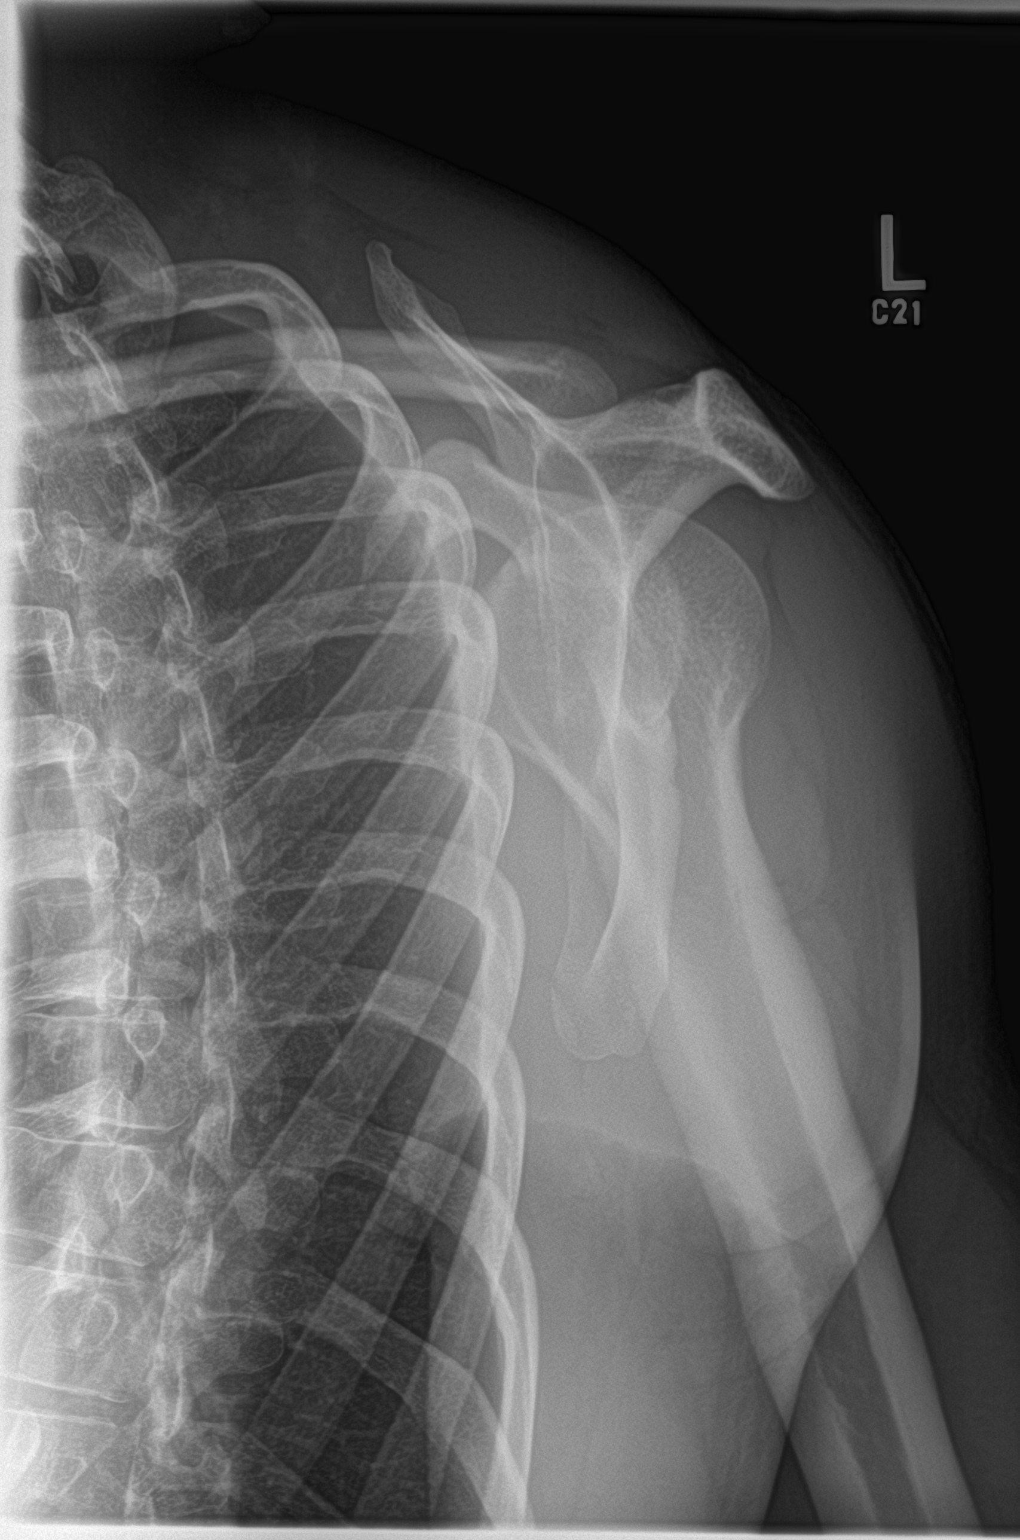

[shoulder axillary]
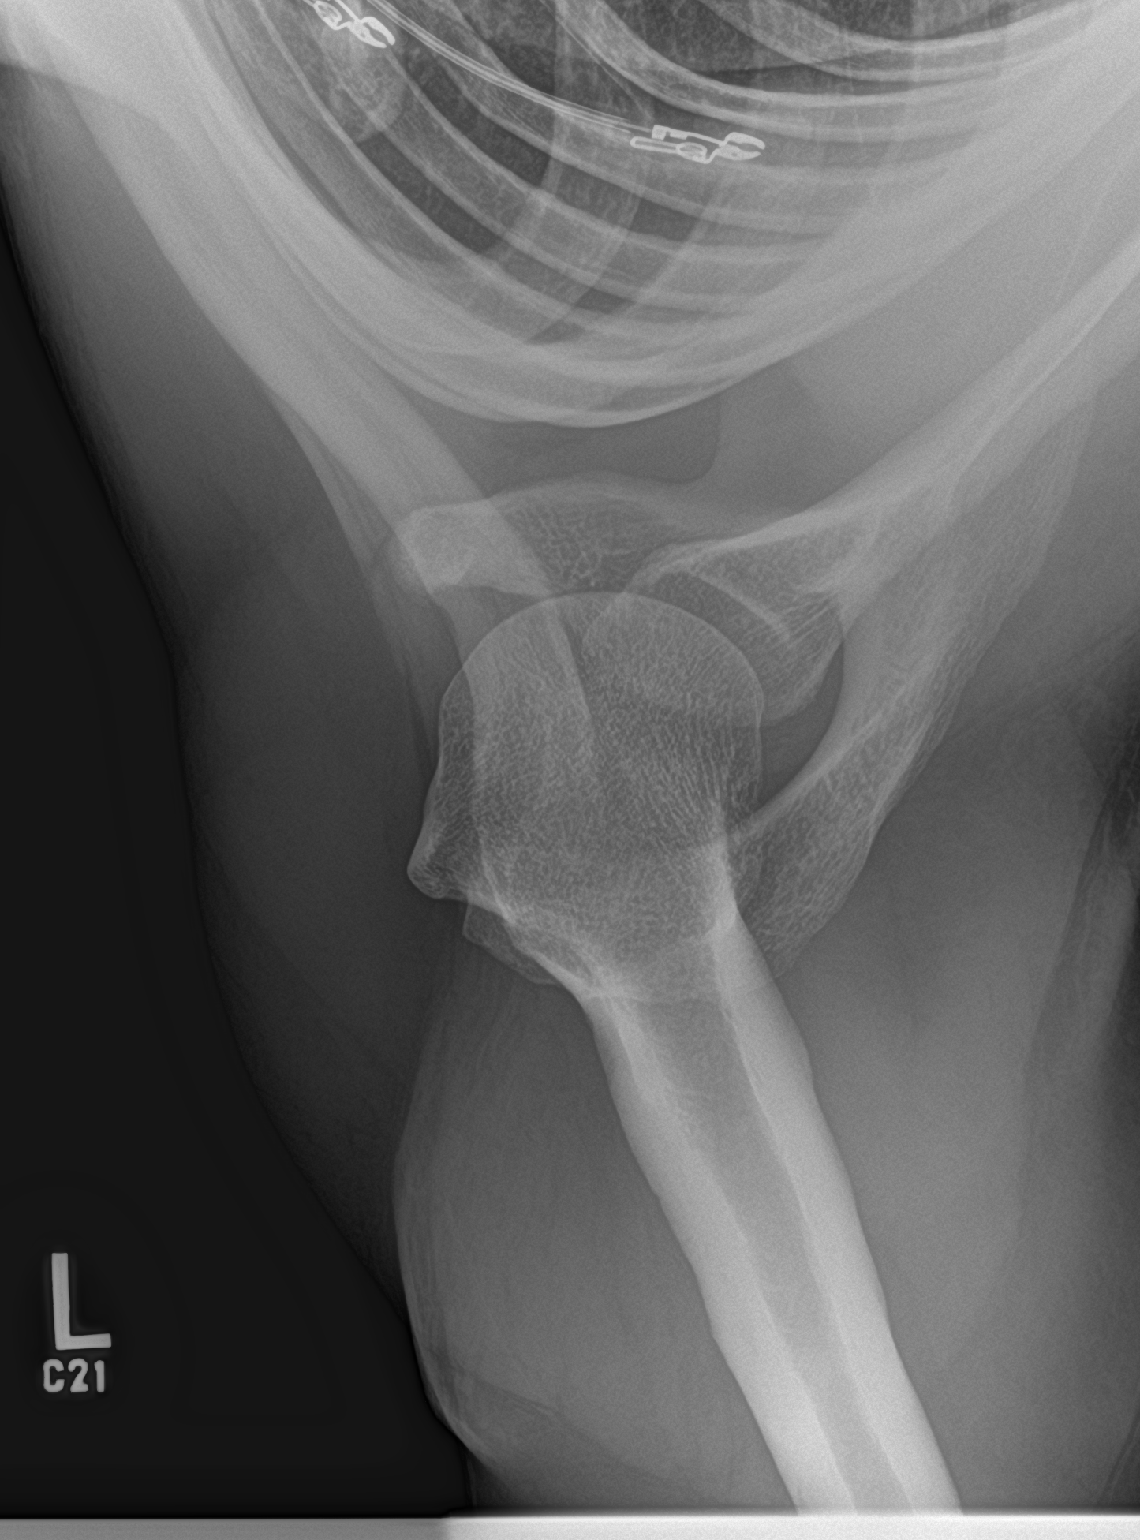

[3 of 3 positions shown; findings below may reference images not displayed]

FINDINGS: There is no evidence of fracture or dislocation. There is no
evidence of arthropathy or other focal bone abnormality. Soft
tissues are unremarkable.
IMPRESSION: No acute osseous injury of the left shoulder.

## 2019-11-25 ENCOUNTER — Emergency Department (HOSPITAL_COMMUNITY)
Admission: EM | Admit: 2019-11-25 | Discharge: 2019-11-26 | Disposition: A | Discharge status: Home / Self Care | Payer: BLUE CROSS/BLUE SHIELD | Attending: Emergency Medicine | Admitting: Emergency Medicine

## 2019-11-25 ENCOUNTER — Encounter (HOSPITAL_COMMUNITY): Payer: Self-pay | Admitting: Emergency Medicine

## 2019-11-25 ENCOUNTER — Other Ambulatory Visit: Payer: Self-pay

## 2019-11-25 DIAGNOSIS — Z5321 Procedure and treatment not carried out due to patient leaving prior to being seen by health care provider: Secondary | ICD-10-CM | POA: Insufficient documentation

## 2019-11-25 DIAGNOSIS — M549 Dorsalgia, unspecified: Secondary | ICD-10-CM | POA: Insufficient documentation

## 2019-11-25 NOTE — ED Triage Notes (Signed)
C/o lower back pain x 1 week.  No known injury.  Took Naproxen yesterday with some relief.

## 2019-11-26 NOTE — ED Notes (Signed)
Pt called for vitals with no answer x3 

## 2020-01-28 ENCOUNTER — Other Ambulatory Visit: Payer: Self-pay

## 2020-01-28 ENCOUNTER — Ambulatory Visit (HOSPITAL_COMMUNITY)
Admission: EM | Admit: 2020-01-28 | Discharge: 2020-01-28 | Disposition: A | Discharge status: Home / Self Care | Payer: PRIVATE HEALTH INSURANCE | Attending: Family Medicine | Admitting: Family Medicine

## 2020-01-28 ENCOUNTER — Encounter (HOSPITAL_COMMUNITY): Payer: Self-pay | Admitting: Emergency Medicine

## 2020-01-28 DIAGNOSIS — J069 Acute upper respiratory infection, unspecified: Secondary | ICD-10-CM

## 2020-01-28 MED ORDER — BENZONATATE 100 MG PO CAPS: 100.0000 mg | ORAL_CAPSULE | Freq: Three times a day (TID) | ORAL | 0 refills | Status: DC

## 2020-01-28 MED ORDER — CETIRIZINE HCL 10 MG PO TABS: 10.0000 mg | ORAL_TABLET | Freq: Every day | ORAL | 0 refills | Status: AC

## 2020-01-28 NOTE — ED Provider Notes (Signed)
[place]    CSN: [id number] Arrival date & time: 01/28/20  1517      History   Chief Complaint Chief Complaint  Patient presents with  . Cough    HPI John Trevino is a 44 y.o. male.   Patient is a 44 year old male who presents today with productive cough, nasal congestion, body aches, chills x3 days.  Symptoms have been constant, waxing waning.  He has been taking Mucinex and TheraFlu.  Reporting family members sick with similar symptoms.  Tested for Covid on 3/6 with negative results.  Requesting note for work.  No chest pain or shortness of breath.  ROS per HPI      Past Medical History:  Diagnosis Date  . Bronchitis     There are no problems to display for this patient.   Past Surgical History:  Procedure Laterality Date  . THROAT SURGERY         Home Medications    Prior to Admission medications   Medication Sig Start Date End Date Taking? Authorizing Provider  benzonatate (TESSALON) 100 MG capsule Take 1 capsule (100 mg total) by mouth every 8 (eight) hours. Take 1-2 capsules every 8 hours as needed 01/28/20   Loura Halt A, NP  cetirizine (ZYRTEC) 10 MG tablet Take 1 tablet (10 mg total) by mouth daily. 01/28/20   Orvan July, NP    Family History Family History  Family history unknown: Yes    Social History Social History   Tobacco Use  . Smoking status: Current Every Day Smoker    Packs/day: 0.50    Types: Cigarettes  . Smokeless tobacco: Never Used  Substance Use Topics  . Alcohol use: No  . Drug use: Yes    Types: Marijuana     Allergies   Patient has no known allergies.   Review of Systems Review of Systems   Physical Exam Triage Vital Signs ED Triage Vitals [01/28/20 0916]  Enc Vitals Group     BP 129/76     Pulse Rate 72     Resp 18     Temp 97.9 F (36.6 C)     Temp Source Oral     SpO2 99 %     Weight      Height      Head Circumference      Peak Flow      Pain Score 5     Pain Loc      Pain  Edu?      Excl. in [place]?    No data found.  Updated Vital Signs BP 129/76 (BP Location: Right Arm)   Pulse 72   Temp 97.9 F (36.6 C) (Oral)   Resp 18   SpO2 99%   Visual Acuity Right Eye Distance:   Left Eye Distance:   Bilateral Distance:    Right Eye Near:   Left Eye Near:    Bilateral Near:     Physical Exam Vitals and nursing note reviewed.  Constitutional:      General: He is not in acute distress.    Appearance: Normal appearance. He is not ill-appearing, toxic-appearing or diaphoretic.  HENT:     Head: Normocephalic and atraumatic.     Right Ear: Ear canal normal. There is impacted cerumen.     Left Ear: Tympanic membrane and ear canal normal.     Nose: Nose normal.     Mouth/Throat:     Pharynx: Oropharynx is clear.  Eyes:  Conjunctiva/sclera: Conjunctivae normal.  Cardiovascular:     Rate and Rhythm: Normal rate and regular rhythm.     Pulses: Normal pulses.     Heart sounds: Normal heart sounds.  Pulmonary:     Effort: Pulmonary effort is normal.     Breath sounds: Normal breath sounds.  Musculoskeletal:        General: Normal range of motion.     Cervical back: Normal range of motion.  Skin:    General: Skin is warm and dry.  Neurological:     Mental Status: He is alert.  Psychiatric:        Mood and Affect: Mood normal.      UC Treatments / Results  Labs (all labs ordered are listed, but only abnormal results are displayed) Labs Reviewed - No data to display  EKG   Radiology No results found.  Procedures Procedures (including critical care time)  Medications Ordered in UC Medications - No data to display  Initial Impression / Assessment and Plan / UC Course  I have reviewed the triage vital signs and the nursing notes.  Pertinent labs & imaging results that were available during my care of the patient were reviewed by me and considered in my medical decision making (see chart for details).     Viral URI with cough.  Exam  benign.  Lungs clear on exam.Most likely some sort of viral illness.  We will have him continue the Mucinex.  Tessalon Perles as needed for cough.  Start Zyrtec daily. Covid test done Quarantine precautions given and work note given. Final Clinical Impressions(s) / UC Diagnoses   Final diagnoses:  Viral URI with cough     Discharge Instructions     Your lungs are clear I do not think this is pneumonia. This is most likely some sort of viral illness. Tessalon Perles for cough Zyrtec for presence of drip, nasal congestion and sneezing. Follow up as needed for continued or worsening symptoms     ED Prescriptions    Medication Sig Dispense Auth. Provider   benzonatate (TESSALON) 100 MG capsule Take 1 capsule (100 mg total) by mouth every 8 (eight) hours. Take 1-2 capsules every 8 hours as needed 21 capsule Hamdi Kley A, NP   cetirizine (ZYRTEC) 10 MG tablet Take 1 tablet (10 mg total) by mouth daily. 30 tablet Dahlia Byes A, NP     PDMP not reviewed this encounter.   Dahlia Byes A, NP 01/28/20 1220

## 2020-01-28 NOTE — ED Triage Notes (Signed)
Pt here for productive cough and nasal congestion x 3 days; pt sts had negative covid test at [facility] on 3/6 but not feeling better and needs note for work

## 2020-01-28 NOTE — Discharge Instructions (Signed)
Your lungs are clear I do not think this is pneumonia. This is most likely some sort of viral illness. Tessalon Perles for cough Zyrtec for presence of drip, nasal congestion and sneezing. Follow up as needed for continued or worsening symptoms

## 2020-03-05 ENCOUNTER — Other Ambulatory Visit: Payer: Self-pay

## 2020-03-05 ENCOUNTER — Encounter (HOSPITAL_COMMUNITY): Payer: Self-pay | Admitting: Family Medicine

## 2020-03-05 ENCOUNTER — Ambulatory Visit (HOSPITAL_COMMUNITY)
Admission: EM | Admit: 2020-03-05 | Discharge: 2020-03-05 | Disposition: A | Discharge status: Home / Self Care | Payer: PRIVATE HEALTH INSURANCE | Attending: Emergency Medicine | Admitting: Emergency Medicine

## 2020-03-05 DIAGNOSIS — Z20822 Contact with and (suspected) exposure to covid-19: Secondary | ICD-10-CM | POA: Diagnosis present

## 2020-03-05 NOTE — Discharge Instructions (Addendum)
We have tested you for covid  You can check your my chart for results.  

## 2020-03-05 NOTE — ED Triage Notes (Signed)
Pt reports exposure to COVID 2 days ago. No symptoms.

## 2020-03-06 LAB — SARS CORONAVIRUS 2 (TAT 6-24 HRS): SARS Coronavirus 2: NEGATIVE

## 2020-03-06 NOTE — ED Provider Notes (Signed)
[facility]    CSN: [id number] Arrival date & time: 03/05/20  1249      History   Chief Complaint Chief Complaint  Patient presents with  . Covid Exposure    HPI John Trevino is a 44 y.o. male.   Patient is a 44 year old male presents today for Covid exposure.  This occurred 2 days ago.  He currently denies any symptoms.     Past Medical History:  Diagnosis Date  . Bronchitis     There are no problems to display for this patient.   Past Surgical History:  Procedure Laterality Date  . THROAT SURGERY         Home Medications    Prior to Admission medications   Medication Sig Start Date End Date Taking? Authorizing Provider  cetirizine (ZYRTEC) 10 MG tablet Take 1 tablet (10 mg total) by mouth daily. 01/28/20   Janace Aris, NP    Family History Family History  Family history unknown: Yes    Social History Social History   Tobacco Use  . Smoking status: Current Every Day Smoker    Packs/day: 0.50    Types: Cigarettes  . Smokeless tobacco: Never Used  Substance Use Topics  . Alcohol use: No  . Drug use: Yes    Types: Marijuana     Allergies   Patient has no known allergies.   Review of Systems Review of Systems   Physical Exam Triage Vital Signs ED Triage Vitals  Enc Vitals Group     BP 03/05/20 1404 126/76     Pulse Rate 03/05/20 1358 72     Resp 03/05/20 1358 16     Temp 03/05/20 1358 98.4 F (36.9 C)     Temp src --      SpO2 03/05/20 1358 100 %     Weight --      Height --      Head Circumference --      Peak Flow --      Pain Score 03/05/20 1359 0     Pain Loc --      Pain Edu? --      Excl. in [place]? --    No data found.  Updated Vital Signs BP 126/76   Pulse 72   Temp 98.4 F (36.9 C)   Resp 16   SpO2 100%   Visual Acuity Right Eye Distance:   Left Eye Distance:   Bilateral Distance:    Right Eye Near:   Left Eye Near:    Bilateral Near:     Physical Exam Vitals and nursing note reviewed.    Constitutional:      Appearance: Normal appearance.  HENT:     Head: Normocephalic and atraumatic.     Nose: Nose normal.  Eyes:     Conjunctiva/sclera: Conjunctivae normal.  Pulmonary:     Effort: Pulmonary effort is normal.  Musculoskeletal:        General: Normal range of motion.     Cervical back: Normal range of motion.  Skin:    General: Skin is warm and dry.  Neurological:     Mental Status: He is alert.  Psychiatric:        Mood and Affect: Mood normal.      UC Treatments / Results  Labs (all labs ordered are listed, but only abnormal results are displayed) Labs Reviewed  SARS CORONAVIRUS 2 (TAT 6-24 HRS)    EKG   Radiology No results found.  Procedures Procedures (including critical care time)  Medications Ordered in UC Medications - No data to display  Initial Impression / Assessment and Plan / UC Course  I have reviewed the triage vital signs and the nursing notes.  Pertinent labs & imaging results that were available during my care of the patient were reviewed by me and considered in my medical decision making (see chart for details).     Lab testing for Covid-swab sent for testing with labs pending Final Clinical Impressions(s) / UC Diagnoses   Final diagnoses:  Encounter for laboratory testing for COVID-19 virus     Discharge Instructions     We have tested you for covid  You can check your my chart for results.     ED Prescriptions    None     PDMP not reviewed this encounter.   Orvan July, NP 03/06/20 1201
